# Patient Record
Sex: Female | Born: 1960
Health system: Southern US, Community
[De-identification: ages and names within clinical notes are randomized; demographics above are authoritative.]

## PROBLEM LIST (undated history)

## (undated) DIAGNOSIS — N809 Endometriosis, unspecified: Secondary | ICD-10-CM

## (undated) DIAGNOSIS — T7840XA Allergy, unspecified, initial encounter: Secondary | ICD-10-CM

## (undated) DIAGNOSIS — K602 Anal fissure, unspecified: Secondary | ICD-10-CM

## (undated) DIAGNOSIS — E0590 Thyrotoxicosis, unspecified without thyrotoxic crisis or storm: Secondary | ICD-10-CM

## (undated) HISTORY — PX: OTHER SURGICAL HISTORY: SHX169

## (undated) HISTORY — DX: Anal fissure, unspecified: K60.2

## (undated) HISTORY — DX: Allergy, unspecified, initial encounter: T78.40XA

## (undated) HISTORY — DX: Endometriosis, unspecified: N80.9

## (undated) HISTORY — DX: Thyrotoxicosis, unspecified without thyrotoxic crisis or storm: E05.90

## (undated) HISTORY — PX: LAPAROSCOPY: SHX197

---

## 1998-08-19 ENCOUNTER — Other Ambulatory Visit: Admission: RE | Admit: 1998-08-19 | Discharge: 1998-08-19 | Payer: Self-pay | Admitting: Obstetrics and Gynecology

## 1998-08-24 DIAGNOSIS — E039 Hypothyroidism, unspecified: Secondary | ICD-10-CM | POA: Insufficient documentation

## 1998-08-24 DIAGNOSIS — Z8639 Personal history of other endocrine, nutritional and metabolic disease: Secondary | ICD-10-CM | POA: Insufficient documentation

## 1998-09-03 ENCOUNTER — Other Ambulatory Visit: Admission: RE | Admit: 1998-09-03 | Discharge: 1998-09-03 | Payer: Self-pay | Admitting: Endocrinology

## 1999-08-05 ENCOUNTER — Other Ambulatory Visit: Admission: RE | Admit: 1999-08-05 | Discharge: 1999-08-05 | Payer: Self-pay | Admitting: *Deleted

## 2000-08-09 ENCOUNTER — Other Ambulatory Visit: Admission: RE | Admit: 2000-08-09 | Discharge: 2000-08-09 | Payer: Self-pay | Admitting: *Deleted

## 2001-08-12 ENCOUNTER — Other Ambulatory Visit: Admission: RE | Admit: 2001-08-12 | Discharge: 2001-08-12 | Payer: Self-pay | Admitting: Obstetrics and Gynecology

## 2002-08-21 ENCOUNTER — Other Ambulatory Visit: Admission: RE | Admit: 2002-08-21 | Discharge: 2002-08-21 | Payer: Self-pay | Admitting: Obstetrics and Gynecology

## 2003-10-08 ENCOUNTER — Other Ambulatory Visit: Admission: RE | Admit: 2003-10-08 | Discharge: 2003-10-08 | Payer: Self-pay | Admitting: Obstetrics and Gynecology

## 2004-07-07 ENCOUNTER — Ambulatory Visit: Payer: Self-pay | Admitting: Internal Medicine

## 2004-08-06 ENCOUNTER — Ambulatory Visit: Payer: Self-pay | Admitting: Internal Medicine

## 2004-10-10 ENCOUNTER — Ambulatory Visit: Payer: Self-pay | Admitting: Internal Medicine

## 2005-01-20 ENCOUNTER — Ambulatory Visit: Payer: Self-pay | Admitting: Internal Medicine

## 2005-07-20 ENCOUNTER — Ambulatory Visit: Payer: Self-pay | Admitting: Internal Medicine

## 2005-08-24 LAB — HM MAMMOGRAPHY: HM Mammogram: NORMAL

## 2006-01-11 ENCOUNTER — Ambulatory Visit: Payer: Self-pay | Admitting: Internal Medicine

## 2006-01-19 ENCOUNTER — Ambulatory Visit: Payer: Self-pay | Admitting: Internal Medicine

## 2006-06-25 ENCOUNTER — Ambulatory Visit: Payer: Self-pay | Admitting: Internal Medicine

## 2006-06-25 LAB — CONVERTED CEMR LAB: TSH: 1.61 microintl units/mL (ref 0.35–5.50)

## 2006-07-02 ENCOUNTER — Ambulatory Visit: Payer: Self-pay | Admitting: Internal Medicine

## 2006-12-23 LAB — CONVERTED CEMR LAB: Pap Smear: NORMAL

## 2007-06-28 ENCOUNTER — Ambulatory Visit: Payer: Self-pay | Admitting: Internal Medicine

## 2007-06-28 LAB — CONVERTED CEMR LAB
ALT: 16 units/L (ref 0–35)
AST: 20 units/L (ref 0–37)
Albumin: 3.4 g/dL — ABNORMAL LOW (ref 3.5–5.2)
Alkaline Phosphatase: 80 units/L (ref 39–117)
BUN: 9 mg/dL (ref 6–23)
Basophils Absolute: 0.1 10*3/uL (ref 0.0–0.1)
Basophils Relative: 1.3 % — ABNORMAL HIGH (ref 0.0–1.0)
Bilirubin Urine: NEGATIVE
Bilirubin, Direct: 0.1 mg/dL (ref 0.0–0.3)
Blood in Urine, dipstick: NEGATIVE
CO2: 27 meq/L (ref 19–32)
Calcium: 9.2 mg/dL (ref 8.4–10.5)
Chloride: 106 meq/L (ref 96–112)
Cholesterol: 193 mg/dL (ref 0–200)
Creatinine, Ser: 0.7 mg/dL (ref 0.4–1.2)
Eosinophils Absolute: 0.2 10*3/uL (ref 0.0–0.6)
Eosinophils Relative: 2.1 % (ref 0.0–5.0)
GFR calc Af Amer: 116 mL/min
GFR calc non Af Amer: 96 mL/min
Glucose, Bld: 81 mg/dL (ref 70–99)
Glucose, Urine, Semiquant: NEGATIVE
HCT: 36.8 % (ref 36.0–46.0)
HDL: 42.8 mg/dL (ref >39.0)
Hemoglobin: 12.6 g/dL (ref 12.0–15.0)
Ketones, urine, test strip: NEGATIVE
LDL Cholesterol: 120 mg/dL — ABNORMAL HIGH (ref 0–99)
Lymphocytes Relative: 21.1 % (ref 12.0–46.0)
MCHC: 34.2 g/dL (ref 30.0–36.0)
MCV: 93.8 fL (ref 78.0–100.0)
Monocytes Absolute: 0.5 10*3/uL (ref 0.2–0.7)
Monocytes Relative: 5 % (ref 3.0–11.0)
Neutro Abs: 7.2 10*3/uL (ref 1.4–7.7)
Neutrophils Relative %: 70.5 % (ref 43.0–77.0)
Nitrite: NEGATIVE
Platelets: 283 10*3/uL (ref 150–400)
Potassium: 4.6 meq/L (ref 3.5–5.1)
Protein, U semiquant: NEGATIVE
RBC: 3.93 M/uL (ref 3.87–5.11)
RDW: 12 % (ref 11.5–14.6)
Sodium: 140 meq/L (ref 135–145)
Specific Gravity, Urine: >=1.03
TSH: 1.65 microintl units/mL (ref 0.35–5.50)
Total Bilirubin: 0.5 mg/dL (ref 0.3–1.2)
Total CHOL/HDL Ratio: 4.5
Total Protein: 6.8 g/dL (ref 6.0–8.3)
Triglycerides: 151 mg/dL — ABNORMAL HIGH (ref 0–149)
Urobilinogen, UA: 0.2
VLDL: 30 mg/dL (ref 0–40)
WBC Urine, dipstick: NEGATIVE
WBC: 10.2 10*3/uL (ref 4.5–10.5)
pH: 5

## 2007-07-04 ENCOUNTER — Ambulatory Visit: Payer: Self-pay | Admitting: Internal Medicine

## 2007-07-04 DIAGNOSIS — N809 Endometriosis, unspecified: Secondary | ICD-10-CM | POA: Insufficient documentation

## 2008-06-13 ENCOUNTER — Telehealth: Payer: Self-pay | Admitting: Internal Medicine

## 2008-06-18 ENCOUNTER — Ambulatory Visit: Payer: Self-pay | Admitting: Internal Medicine

## 2008-06-18 LAB — CONVERTED CEMR LAB: TSH: 1.08 microintl units/mL (ref 0.35–5.50)

## 2008-06-27 ENCOUNTER — Ambulatory Visit: Payer: Self-pay | Admitting: Internal Medicine

## 2010-02-05 ENCOUNTER — Ambulatory Visit: Payer: Self-pay | Admitting: Internal Medicine

## 2010-02-05 LAB — CONVERTED CEMR LAB
ALT: 21 units/L (ref 0–35)
AST: 23 units/L (ref 0–37)
Albumin: 4.3 g/dL (ref 3.5–5.2)
Alkaline Phosphatase: 95 units/L (ref 39–117)
BUN: 15 mg/dL (ref 6–23)
Basophils Absolute: 0 10*3/uL (ref 0.0–0.1)
Basophils Relative: 0.3 % (ref 0.0–3.0)
Bilirubin Urine: NEGATIVE
Bilirubin, Direct: 0.1 mg/dL (ref 0.0–0.3)
Blood in Urine, dipstick: NEGATIVE
CO2: 29 meq/L (ref 19–32)
Calcium: 9.3 mg/dL (ref 8.4–10.5)
Chloride: 108 meq/L (ref 96–112)
Cholesterol: 171 mg/dL (ref 0–200)
Creatinine, Ser: 0.6 mg/dL (ref 0.4–1.2)
Eosinophils Absolute: 0.2 10*3/uL (ref 0.0–0.7)
Eosinophils Relative: 3.2 % (ref 0.0–5.0)
GFR calc non Af Amer: 108.53 mL/min (ref >60)
Glucose, Bld: 86 mg/dL (ref 70–99)
Glucose, Urine, Semiquant: NEGATIVE
HCT: 37.5 % (ref 36.0–46.0)
HDL: 49.3 mg/dL (ref >39.00)
Hemoglobin: 12.6 g/dL (ref 12.0–15.0)
Ketones, urine, test strip: NEGATIVE
LDL Cholesterol: 95 mg/dL (ref 0–99)
Lymphocytes Relative: 25 % (ref 12.0–46.0)
Lymphs Abs: 1.8 10*3/uL (ref 0.7–4.0)
MCHC: 33.6 g/dL (ref 30.0–36.0)
MCV: 96.2 fL (ref 78.0–100.0)
Monocytes Absolute: 0.5 10*3/uL (ref 0.1–1.0)
Monocytes Relative: 6.9 % (ref 3.0–12.0)
Neutro Abs: 4.8 10*3/uL (ref 1.4–7.7)
Neutrophils Relative %: 64.6 % (ref 43.0–77.0)
Nitrite: NEGATIVE
Platelets: 213 10*3/uL (ref 150.0–400.0)
Potassium: 4.3 meq/L (ref 3.5–5.1)
RBC: 3.9 M/uL (ref 3.87–5.11)
RDW: 13.2 % (ref 11.5–14.6)
Sodium: 143 meq/L (ref 135–145)
Specific Gravity, Urine: 1.025
TSH: 1.45 microintl units/mL (ref 0.35–5.50)
Total Bilirubin: 0.5 mg/dL (ref 0.3–1.2)
Total CHOL/HDL Ratio: 3
Total Protein: 7 g/dL (ref 6.0–8.3)
Triglycerides: 136 mg/dL (ref 0.0–149.0)
Urobilinogen, UA: 0.2
VLDL: 27.2 mg/dL (ref 0.0–40.0)
WBC Urine, dipstick: NEGATIVE
WBC: 7.4 10*3/uL (ref 4.5–10.5)
pH: 6

## 2010-02-12 ENCOUNTER — Ambulatory Visit: Payer: Self-pay | Admitting: Internal Medicine

## 2010-08-27 ENCOUNTER — Other Ambulatory Visit: Payer: Self-pay | Admitting: Internal Medicine

## 2010-08-27 ENCOUNTER — Ambulatory Visit
Admission: RE | Admit: 2010-08-27 | Discharge: 2010-08-27 | Payer: Self-pay | Source: Home / Self Care | Attending: Internal Medicine | Admitting: Internal Medicine

## 2010-08-27 LAB — TSH: TSH: 1.44 u[IU]/mL (ref 0.35–5.50)

## 2010-09-25 NOTE — Assessment & Plan Note (Signed)
Summary: cpx/no pap/njr   Vital Signs:  Patient profile:   50 year old female Menstrual status:  perimenopausal LMP:     09/18/2009 Height:      65.5 inches Weight:      193 pounds BMI:     31.74 Pulse rate:   60 / minute Pulse rhythm:   regular Resp:     12 per minute BP sitting:   132 / 96  (left arm) Cuff size:   regular  Vitals Entered By: Gladis Riffle, RN (February 12, 2010 10:14 AM) CC: cpx, has gyn, labs done--stopped levothyroxine due to tachycardia, feels better without it--c/o fatigue Is Patient Diabetic? No Comments c/o lump in throat, states hx nodules LMP (date): 09/18/2009     Menstrual Status perimenopausal Enter LMP: 09/18/2009 Last PAP Result Normal-pt's report   CC:  cpx, has gyn, labs done--stopped levothyroxine due to tachycardia, and feels better without it--c/o fatigue.  History of Present Illness: cpx  Preventive Screening-Counseling & Management  Alcohol-Tobacco     Smoking Status: quit     Year Quit: 1999  Current Problems (verified): 1)  Preventive Health Care  (ICD-V70.0) 2)  Hyperthyroidism  (ICD-242.90) 3)  Endometriosis  (ICD-617.9) 4)  Hypothyroidism  (ICD-244.9)  Current Medications (verified): 1)  None  Allergies: 1)  ! Cephalexin (Cephalexin) 2)  ! Levothyroxine Sodium (Levothyroxine Sodium)  Past History:  Past Surgical History: Last updated: 07/04/2007 laparoscopy x 2  Family History: Last updated: 02/12/2010 mother hypothyroid, stroke (77 yo) father alive and well  Social History: Last updated: 02/12/2010 Married Former Smoker Alcohol use-no Regular exercise-no 2 kids - healthy  Risk Factors: Exercise: no (07/04/2007)  Risk Factors: Smoking Status: quit (02/12/2010)  Past Medical History: Hyperthyroidism---multinodular goiter (I-131) see duplicate record endometriosis  Family History: mother hypothyroid, stroke (40 yo) father alive and well  Social History: Married Former Smoker Alcohol  use-no Regular exercise-no 2 kids - healthy  Physical Exam  General:  alert and well-developed.   Head:  normocephalic and atraumatic.   Eyes:  pupils equal and pupils round.   Ears:  R ear normal and L ear normal.   Neck:  No deformities, , or tenderness noted. thyroid enlarged. bilateral nodules---maximum 1 cm Chest Wall:  No deformities, masses, or tenderness noted. Lungs:  normal respiratory effort and no intercostal retractions.   Heart:  normal rate and regular rhythm.   Abdomen:  soft and non-tender.   Msk:  No deformity or scoliosis noted of thoracic or lumbar spine.   Neurologic:  cranial nerves II-XII intact and gait normal.   Skin:  Intact without suspicious lesions or rashes Cervical Nodes:  no anterior cervical adenopathy and no posterior cervical adenopathy.   Psych:  memory intact for recent and remote and good eye contact.     Impression & Recommendations:  Problem # 1:  PREVENTIVE HEALTH CARE (ICD-V70.0) discussed need for aggressive weight loss advised daily exercis of at least one hour advised low calorie diet  Problem # 2:  HYPERTHYROIDISM (ICD-242.90) has been treated required replacement therapy---has stopped and TSH normal---ok to stay off replacement thyroid meds  Other Orders: Tdap => 13yrs IM (04540) Admin 1st Vaccine ([id number])  Patient Instructions: 1)  Please schedule a follow-up appointment in 6 months labs only 2)  tsh 241.9   Immunizations Administered:  Tetanus Vaccine:    Vaccine Type: Tdap    Site: left deltoid    Mfr: GlaxoSmithKline    Dose: 0.5 ml    Route: IM  Given by: Gladis Riffle, RN    Exp. Date: 11/15/2011    Lot #: [id number]

## 2010-11-13 ENCOUNTER — Telehealth: Payer: Self-pay | Admitting: Internal Medicine

## 2010-11-13 NOTE — Telephone Encounter (Signed)
Pt is having sinus pain in her face.  She wants to know if you can see her or if she should go to [facility]

## 2010-11-14 ENCOUNTER — Ambulatory Visit: Payer: Self-pay | Admitting: Internal Medicine

## 2010-11-14 NOTE — Telephone Encounter (Signed)
lmoam to return call to confirm appointment as indicated.

## 2010-11-14 NOTE — Telephone Encounter (Signed)
lmoam to confirm appt as indicated.

## 2010-11-14 NOTE — Telephone Encounter (Signed)
See me 1130 today

## 2010-11-14 NOTE — Telephone Encounter (Signed)
Sent to [place] to add appt.

## 2012-04-24 LAB — HM MAMMOGRAPHY

## 2012-04-24 LAB — HM PAP SMEAR

## 2012-04-27 ENCOUNTER — Other Ambulatory Visit: Payer: Self-pay

## 2012-05-04 ENCOUNTER — Encounter: Payer: Self-pay | Admitting: Internal Medicine

## 2012-05-30 ENCOUNTER — Other Ambulatory Visit (INDEPENDENT_AMBULATORY_CARE_PROVIDER_SITE_OTHER): Payer: BC Managed Care – PPO

## 2012-05-30 DIAGNOSIS — Z0000 Encounter for general adult medical examination without abnormal findings: Secondary | ICD-10-CM

## 2012-05-30 LAB — HEPATIC FUNCTION PANEL
ALT: 18 U/L (ref 0–35)
Albumin: 4.2 g/dL (ref 3.5–5.2)
Alkaline Phosphatase: 80 U/L (ref 39–117)
Bilirubin, Direct: 0.1 mg/dL (ref 0.0–0.3)
Total Protein: 7.8 g/dL (ref 6.0–8.3)

## 2012-05-30 LAB — BASIC METABOLIC PANEL
CO2: 30 mEq/L (ref 19–32)
Chloride: 107 mEq/L (ref 96–112)
Creatinine, Ser: 0.6 mg/dL (ref 0.4–1.2)
Sodium: 143 mEq/L (ref 135–145)

## 2012-05-30 LAB — LIPID PANEL
LDL Cholesterol: 109 mg/dL — ABNORMAL HIGH (ref 0–99)
Total CHOL/HDL Ratio: 4
VLDL: 30.2 mg/dL (ref 0.0–40.0)

## 2012-05-30 LAB — CBC WITH DIFFERENTIAL/PLATELET
Basophils Relative: 0.3 % (ref 0.0–3.0)
Eosinophils Absolute: 0.1 10*3/uL (ref 0.0–0.7)
Eosinophils Relative: 1.7 % (ref 0.0–5.0)
Hemoglobin: 13 g/dL (ref 12.0–15.0)
Lymphocytes Relative: 24.7 % (ref 12.0–46.0)
MCHC: 33.1 g/dL (ref 30.0–36.0)
MCV: 95.6 fl (ref 78.0–100.0)
Monocytes Absolute: 0.5 10*3/uL (ref 0.1–1.0)
Neutro Abs: 4.7 10*3/uL (ref 1.4–7.7)
Neutrophils Relative %: 66.7 % (ref 43.0–77.0)
RBC: 4.11 Mil/uL (ref 3.87–5.11)
WBC: 7.1 10*3/uL (ref 4.5–10.5)

## 2012-05-30 LAB — POCT URINALYSIS DIPSTICK
Blood, UA: NEGATIVE
Ketones, UA: NEGATIVE
Protein, UA: NEGATIVE
Spec Grav, UA: 1.02
Urobilinogen, UA: 0.2
pH, UA: 7.5

## 2012-06-06 ENCOUNTER — Encounter: Payer: Self-pay | Admitting: Internal Medicine

## 2012-06-06 ENCOUNTER — Ambulatory Visit (INDEPENDENT_AMBULATORY_CARE_PROVIDER_SITE_OTHER): Payer: BC Managed Care – PPO | Admitting: Internal Medicine

## 2012-06-06 VITALS — BP 132/82 | HR 76 | Temp 98.0°F | Ht 66.0 in | Wt 183.0 lb

## 2012-06-06 DIAGNOSIS — Z0000 Encounter for general adult medical examination without abnormal findings: Secondary | ICD-10-CM

## 2012-06-06 NOTE — Progress Notes (Signed)
Patient ID: Alison Holloway, female   DOB: 1961/06/19, 51 y.o.   MRN: [id number] cpx  Past Medical History  Diagnosis Date  . Hyperthyroidism   . Endometriosis     History   Social History  . Marital Status: Married    Spouse Name: N/A    Number of Children: N/A  . Years of Education: N/A   Occupational History  . Not on file.   Social History Main Topics  . Smoking status: Former Games developer  . Smokeless tobacco: Not on file  . Alcohol Use: No  . Drug Use: No  . Sexually Active: Not on file   Other Topics Concern  . Not on file   Social History Narrative  . No narrative on file    Past Surgical History  Procedure Date  . Laparoscopy     x2    Family History  Problem Relation Age of Onset  . Stroke Mother   . Hypothyroidism Mother     Allergies  Allergen Reactions  . Cephalexin     REACTION: rash  . Levothyroxine Sodium     REACTION: tachycardia    Current Outpatient Prescriptions on File Prior to Visit  Medication Sig Dispense Refill  . diphenhydrAMINE (SOMINEX) 25 MG tablet Take 25 mg by mouth at bedtime as needed.         patient denies chest pain, shortness of breath, orthopnea. Denies lower extremity edema, abdominal pain, change in appetite, change in bowel movements. Patient denies rashes, musculoskeletal complaints. No other specific complaints in a complete review of systems.   BP 132/82  Pulse 76  Temp 98 F (36.7 C) (Oral)  Ht 5\' 6"  (1.676 m)  Wt 183 lb (83.008 kg)  BMI 29.54 kg/m2  Well-developed well-nourished female in no acute distress. HEENT exam atraumatic, normocephalic, extraocular muscles are intact. Neck is supple. No jugular venous distention no thyromegaly. Chest clear to auscultation without increased work of breathing. Cardiac exam S1 and S2 are regular. Abdominal exam active bowel sounds, soft, nontender. Extremities no edema. Neurologic exam she is alert without any motor sensory deficits. Gait is normal.  A/P- well visit -  Health maint UTD

## 2014-05-21 ENCOUNTER — Encounter: Payer: Self-pay | Admitting: Internal Medicine

## 2014-05-21 ENCOUNTER — Ambulatory Visit (INDEPENDENT_AMBULATORY_CARE_PROVIDER_SITE_OTHER): Payer: BC Managed Care – PPO | Admitting: Internal Medicine

## 2014-05-21 VITALS — BP 136/84 | HR 84 | Temp 98.3°F | Resp 16 | Wt 167.0 lb

## 2014-05-21 DIAGNOSIS — E039 Hypothyroidism, unspecified: Secondary | ICD-10-CM

## 2014-05-21 DIAGNOSIS — E042 Nontoxic multinodular goiter: Secondary | ICD-10-CM

## 2014-05-21 NOTE — Progress Notes (Addendum)
Patient ID: Alison Holloway, female   DOB: 1961-02-13, 53 y.o.   MRN: [id number]   HPI  Alison CERVENY is a 53 y.o.-year-old female, self-referred, for management of h/o Graves ds., h/o post-ablative hypothyroidism. Pt saw Dr Cato Mulligan in the past, now seeing her ObGyn, no PCP.  Pt. has been dx with Graves ds ~1996, and had RAI tx in ~2000 (no records available), then developed hypothyroidism afterwards >> was on Levothyroxine then stopped 3 years ago as she started to feel hyperthyroid again >> resolved after stopping LT4.  I reviewed pt's thyroid tests: Lab Results  Component Value Date   TSH 1.95 05/30/2012   TSH 1.44 08/27/2010   TSH 1.45 02/05/2010   TSH 1.08 06/18/2008   TSH 1.65 06/28/2007   TSH 1.61 06/25/2006    Pt describes: - no fatigue - + weight loss (30 lbs over 2 years) - no cold intolerance - no constipation - no dry skin - + hair falling, not abnormal - no depression, no anxiety - no palpitations - no tremors - no insomnia - + hot flushes  Pt is feeling nodules in neck - "they come and go", hoarseness, dysphagia/odynophagia, SOB with lying down. She mentions she was told she had nodules in the past and had a thyroid U/S (no records).   She takes Benadryl every night (for sinusitis) for years >> sore throat.   She has + FH of thyroid disorders in: hyperthyroidism. No FH of thyroid cancer.  No h/o radiation tx to head or neck. No recent use of iodine supplements.  ROS: Constitutional: see HPI, + increased urination Eyes: no blurry vision, no xerophthalmia ENT: see HPI Cardiovascular: no CP/SOB/palpitations/leg swelling Respiratory: no cough/SOB Gastrointestinal: no N/V/D/C Musculoskeletal: no muscle/joint aches Skin: no rashes Neurological: no tremors/numbness/tingling/dizziness Psychiatric: no depression/anxiety  Past Medical History  Diagnosis Date  . Hyperthyroidism   . Endometriosis    Past Surgical History  Procedure Laterality Date  . Laparoscopy       x2   History   Social History  . Marital Status: Married    Spouse Name: N/A    Number of Children: 2   Occupational History  . none   Social History Main Topics  . Smoking status: Former Games developer, quit in 1999  . Smokeless tobacco: No  . Alcohol Use: No  . Drug Use: No   Current Outpatient Prescriptions on File Prior to Visit  Medication Sig Dispense Refill  . diphenhydrAMINE (SOMINEX) 25 MG tablet Take 25 mg by mouth at bedtime as needed.       No current facility-administered medications on file prior to visit.   Allergies  Allergen Reactions  . Cephalexin     REACTION: rash  . Levothyroxine Sodium     REACTION: tachycardia   Family History  Problem Relation Age of Onset  . Stroke Mother   . Hypothyroidism Mother   . Hypertension Mother    PE: BP 136/84  Pulse 84  Temp(Src) 98.3 F (36.8 C) (Oral)  Resp 16  Wt 167 lb (75.751 kg) Body mass index is 26.97 kg/(m^2).  Wt Readings from Last 3 Encounters:  05/21/14 167 lb (75.751 kg)  06/06/12 183 lb (83.008 kg)  02/12/10 193 lb (87.544 kg)   Constitutional: overweight, in NAD Eyes: PERRLA, EOMI, no exophthalmos ENT: moist mucous membranes, + thyromegaly - fullness palpated (R lobe), no cervical lymphadenopathy Cardiovascular: RRR, No MRG Respiratory: CTA B Gastrointestinal: abdomen soft, NT, ND, BS+ Musculoskeletal: no deformities, strength intact in  all 4 Skin: moist, warm, no rashes Neurological: no tremor with outstretched hands, DTR normal in all 4  ASSESSMENT: 1. H/o Graves ds.  2. Hypothyroidism  3. R sided thyroid enlargement  PLAN:  1. And 2. Patient with h/o Graves ds ~20 years ago, s/p RAI ablation and subsequent hypothyroidism, previously on levothyroxine therapy, but now euthyroid off LT4. She appears euthyroid. She does have fullness in the R lobe but no neck compression symptoms - We discussed about possible outcomes of RAI tx, many pts remain euthyroid over time. This can mean a  higher risk of recurrence of the hyperthyroidism, so we need to keep an eye on her TFTs - at least every year - will check thyroid tests today: TSH, free T4, free T3 - If these are abnormal, she will need to return in 6-8 weeks for repeat labs - If these are normal, I will see her back in 1 year  3. Thyroid enlargement: - will check a thyroid U/S   Component     Latest Ref Rng 05/22/2014  TSH     0.350 - 4.500 uIU/mL 1.870  Free T4     0.80 - 1.80 ng/dL 4.78  T3, Free     2.3 - 4.2 pg/mL 3.2   06/06/2014 CLINICAL DATA: Hypothyroidism  EXAM: THYROID ULTRASOUND  TECHNIQUE: Ultrasound examination of the thyroid gland and adjacent soft tissues was performed.  COMPARISON: None.  FINDINGS: Right thyroid lobe  Measurements: 7.1 x 2.9 x 3.9 cm. Solid complex right upper pole nodule with calcifications measures 4.4 x 2.5 x 3.3 cm. Hypoechoic lower pole nodule measures 1.6 x 1.3 x 1.7 cm.  Left thyroid lobe  Measurements: 4.4 x 0.9 x 1.2 cm. 5 mm upper pole nodule. 5 mm lower pole nodule.  Isthmus  Thickness: 2 mm. No nodules visualized.  Lymphadenopathy  None visualized.  IMPRESSION: Bilateral nodules. Dominant 4.4 cm right upper pole complex nodule. Findings meet consensus criteria for biopsy. Ultrasound-guided fine needle aspiration should be considered, as per the consensus statement: Management of Thyroid Nodules Detected at [place]: Society of Radiologists in Ultrasound Consensus Conference Statement. Radiology 2005; [id number].   Electronically Signed By: Maryclare Bean M.D. On: 06/06/2014 14:07  I suggested FNA of the 2 dominant nodules:  Adequacy Reason Satisfactory For Evaluation. Diagnosis THYROID, FINE NEEDLE ASPIRATION, RLP (SPECIMEN 1 OF 2 COLLECTED ON 06/12/2014) BENIGN. 9BETHESDA CLASS II). FINDINGS CONSISTENT WITH BENIGN FOLLICULAR NODULE. Jimmy Picket MD Pathologist, Electronic Signature (Case signed 06/13/2014) Specimen Clinical  Information Hypothyroidism, RLP hypoechoic nodule 1.6 x 1.3 x 1.7cm Source Thyroid, Fine Needle Aspiration, RLP, (Specimen 1 of 2, collected on 06/12/2014)  Adequacy Reason Satisfactory For Evaluation. Diagnosis THYROID, FINE NEEDLE ASPIRATION, RUP (SPECIMEN 2 OF 2, COLLECTED ON 06/12/14): BENIGN. (BETHESDA CLASS II). FINDINGS CONSISTENT WITH BENIGN FOLLICULAR NODULE. Jimmy Picket MD Pathologist, Electronic Signature (Case signed 06/13/2014) Specimen Clinical Information Hypothyroidsim, RUP solid complex nodule with microcalcifications 4.4 x 2.5 x 3.3cm Source Thyroid, Fine Needle Aspiration, RUP, (Specimen 2 of 2, collected on 06/12/2014)  Benign thyroid nodules. Will see her back in 1 year, as planned.

## 2014-05-21 NOTE — Patient Instructions (Signed)
Please stop at [facility] lab downstairs. You will be called with the U/S schedule. Please come back for a follow-up appointment in 1 year.

## 2014-05-22 ENCOUNTER — Other Ambulatory Visit: Payer: Self-pay | Admitting: Internal Medicine

## 2014-05-22 LAB — TSH: TSH: 1.87 u[IU]/mL (ref 0.350–4.500)

## 2014-05-22 LAB — T3, FREE: T3, Free: 3.2 pg/mL (ref 2.3–4.2)

## 2014-05-22 LAB — T4, FREE: FREE T4: 1.15 ng/dL (ref 0.80–1.80)

## 2014-06-06 ENCOUNTER — Ambulatory Visit
Admission: RE | Admit: 2014-06-06 | Discharge: 2014-06-06 | Disposition: A | Discharge status: Home / Self Care | Payer: BC Managed Care – PPO | Source: Ambulatory Visit | Attending: Internal Medicine | Admitting: Internal Medicine

## 2014-06-07 ENCOUNTER — Encounter: Payer: Self-pay | Admitting: Internal Medicine

## 2014-06-07 NOTE — Addendum Note (Signed)
Addended by: Carlus PavlovGHERGHE, Norberto Wishon on: 06/07/2014 12:19 PM   Modules accepted: Orders

## 2014-06-12 ENCOUNTER — Ambulatory Visit
Admission: RE | Admit: 2014-06-12 | Discharge: 2014-06-12 | Disposition: A | Discharge status: Home / Self Care | Payer: BC Managed Care – PPO | Source: Ambulatory Visit | Attending: Internal Medicine | Admitting: Internal Medicine

## 2014-06-12 ENCOUNTER — Other Ambulatory Visit (HOSPITAL_COMMUNITY)
Admission: RE | Admit: 2014-06-12 | Discharge: 2014-06-12 | Disposition: A | Discharge status: Home / Self Care | Payer: BC Managed Care – PPO | Source: Ambulatory Visit | Attending: Interventional Radiology | Admitting: Interventional Radiology

## 2014-06-12 DIAGNOSIS — E041 Nontoxic single thyroid nodule: Secondary | ICD-10-CM | POA: Diagnosis present

## 2014-06-12 DIAGNOSIS — E042 Nontoxic multinodular goiter: Secondary | ICD-10-CM

## 2014-09-04 ENCOUNTER — Encounter: Payer: BC Managed Care – PPO | Admitting: Internal Medicine

## 2015-01-13 IMAGING — US US SOFT TISSUE HEAD/NECK
1 series · 14 of 25 positions shown · non-contrast
Comparison: None.

CLINICAL DATA: Hypothyroidism

EXAM:
THYROID ULTRASOUND
TECHNIQUE: Ultrasound examination of the thyroid gland and adjacent soft
tissues was performed.

[Series 1: us soft tissue head/neck · 0.07mm/px · 14 of 62 slices shown]
[im 1/62]
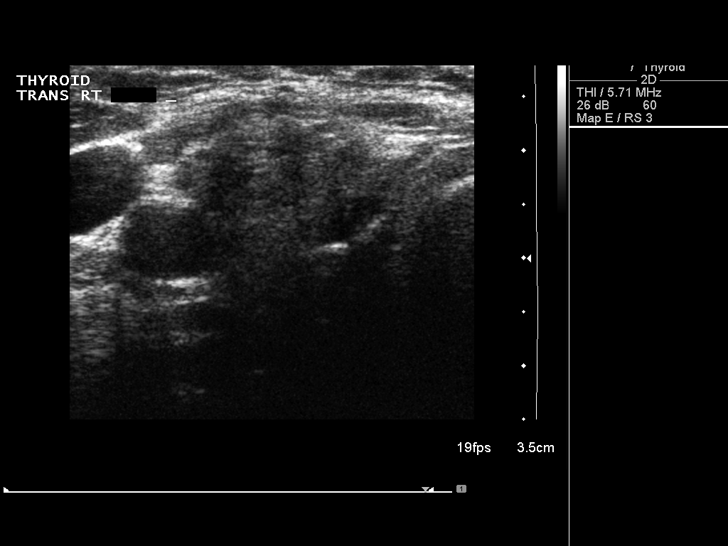
[im 6/62]
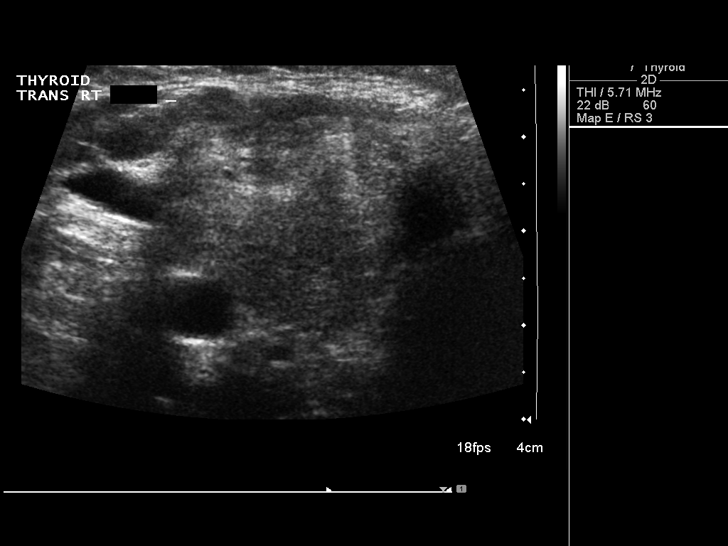
[im 11/62]
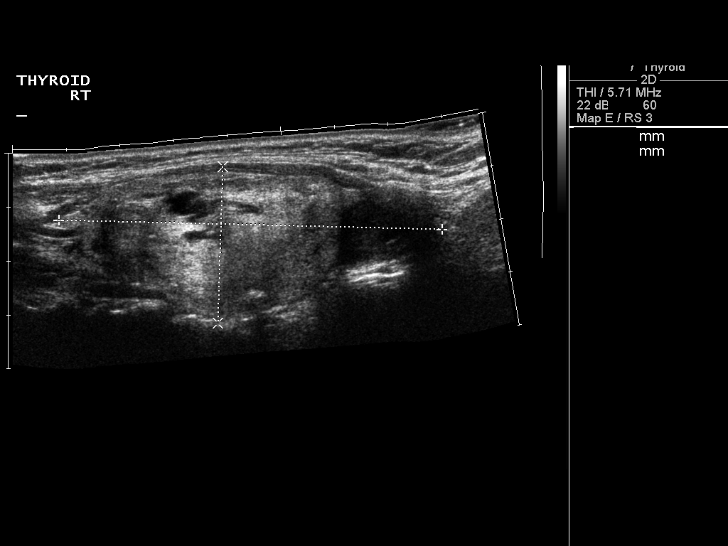
[im 16/62]
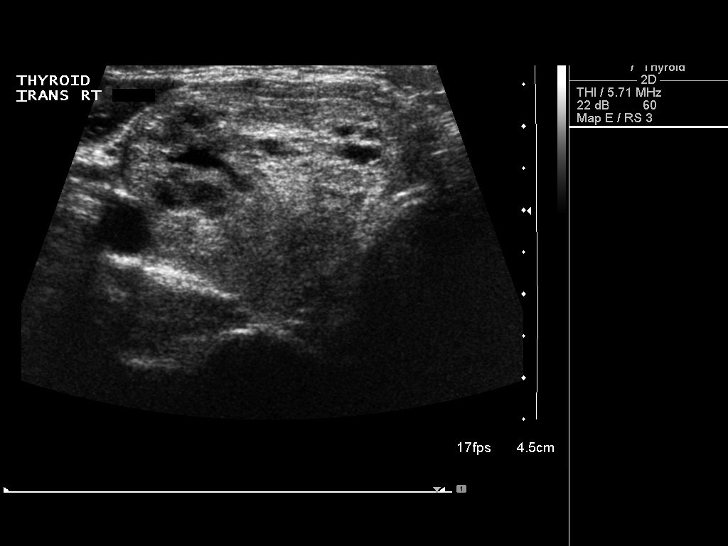
[im 21/62]
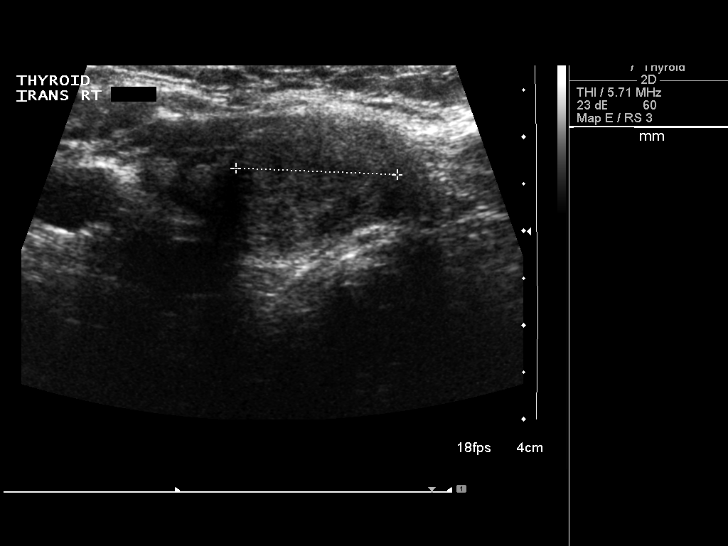
[im 23/62]
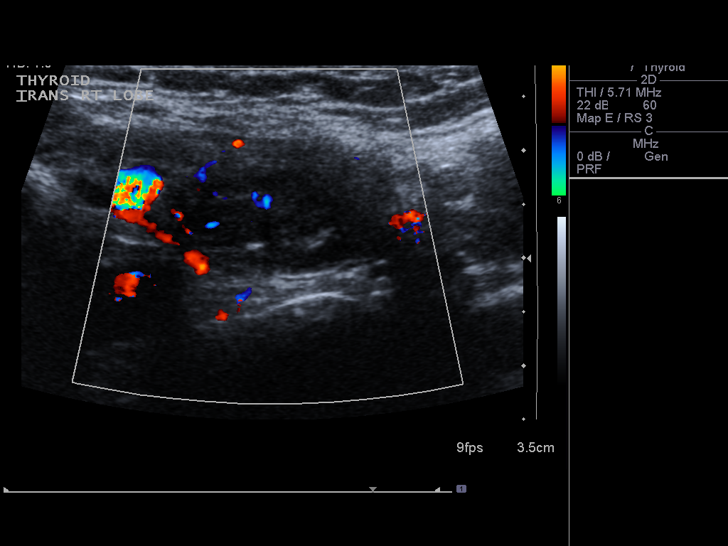
[im 28/62]
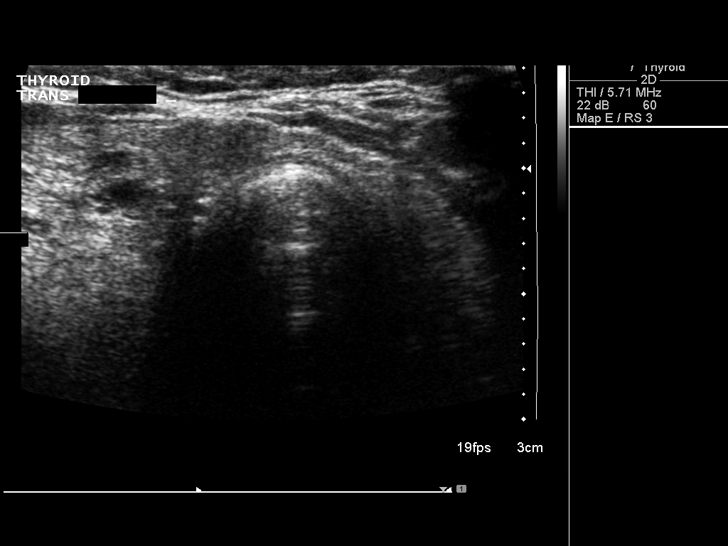
[im 34/62]
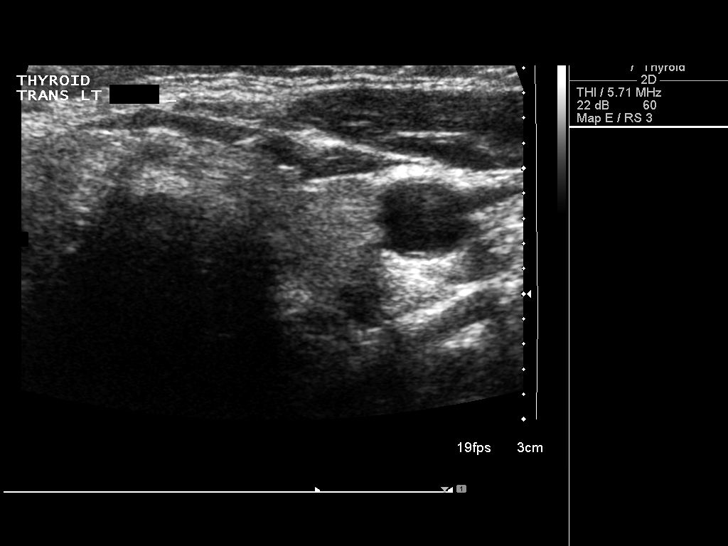
[im 39/62]
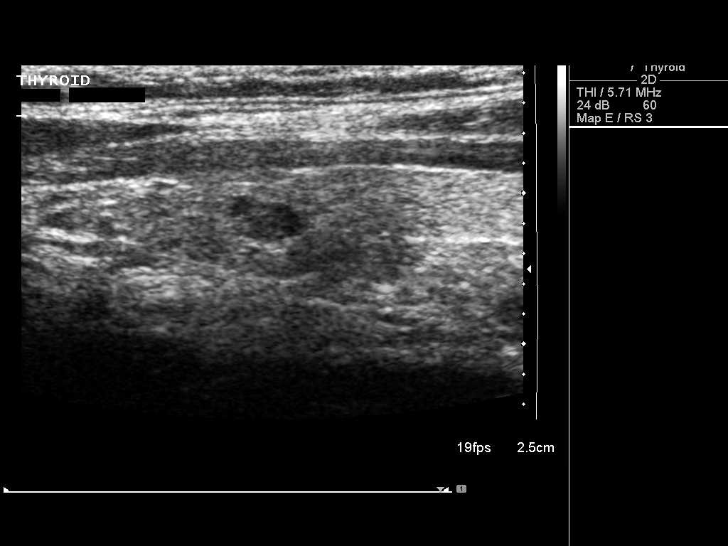
[im 41/62]
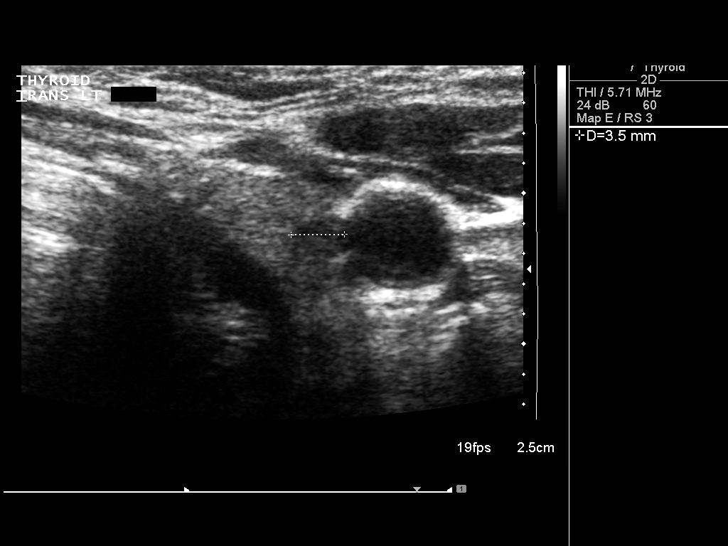
[im 46/62]
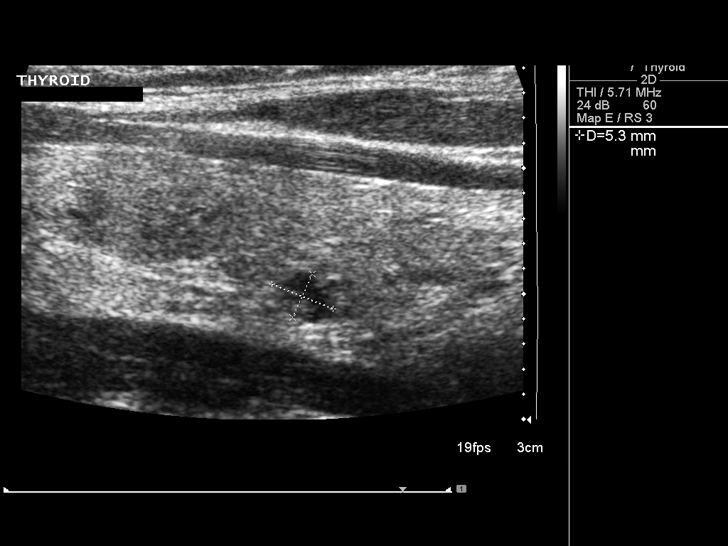
[im 51/62]
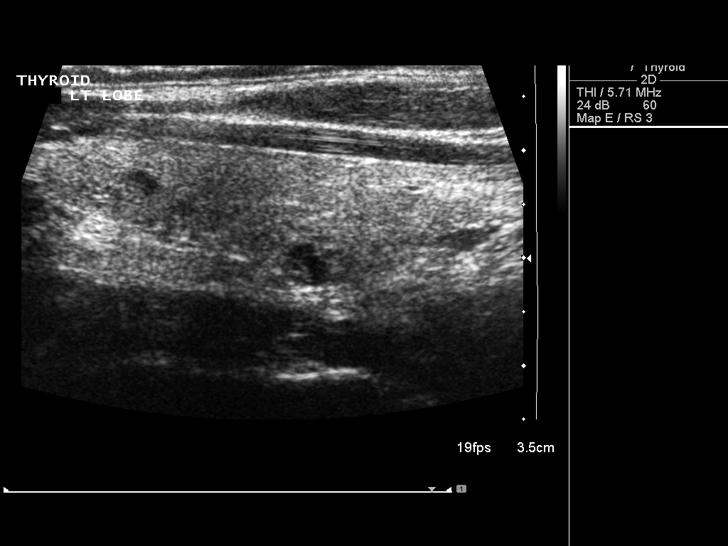
[im 56/62]
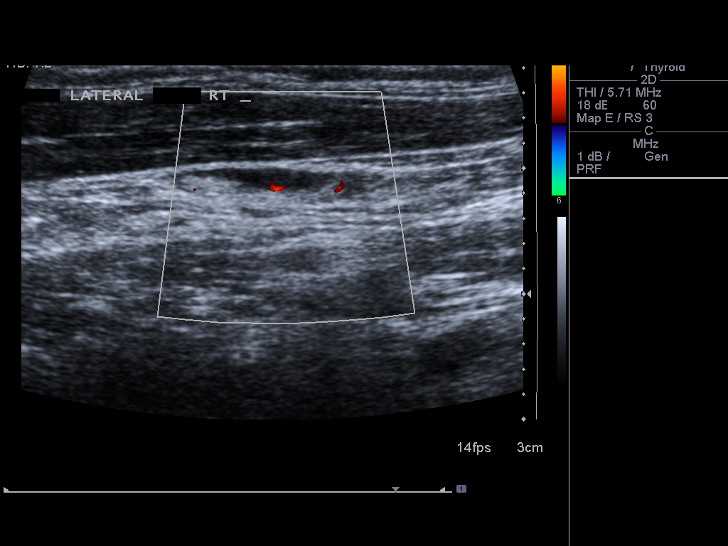
[im 62/62]
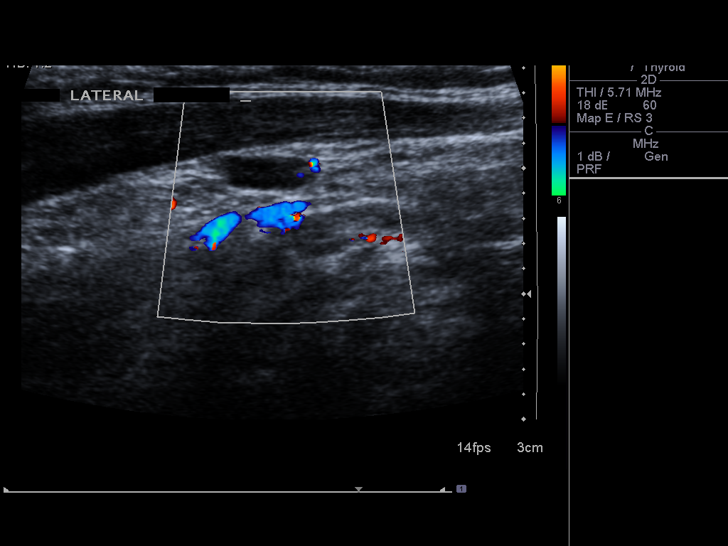

[14 of 25 positions shown; findings below may reference images not displayed]

FINDINGS: Right thyroid lobe

Measurements: 7.1 x 2.9 x 3.9 cm. Solid complex right upper pole
nodule with calcifications measures 4.4 x 2.5 x 3.3 cm. Hypoechoic
lower pole nodule measures 1.6 x 1.3 x 1.7 cm.

Left thyroid lobe

Measurements: 4.4 x 0.9 x 1.2 cm. 5 mm upper pole nodule. 5 mm lower
pole nodule.

Isthmus

Thickness: 2 mm.  No nodules visualized.

Lymphadenopathy

None visualized.
IMPRESSION: Bilateral nodules. Dominant 4.4 cm right upper pole complex nodule.
Findings meet consensus criteria for biopsy. Ultrasound-guided fine
needle aspiration should be considered, as per the consensus
statement: Management of Thyroid Nodules Detected at [place]: Society of
Radiologists in Ultrasound Consensus Conference Statement. Radiology

## 2015-02-05 ENCOUNTER — Ambulatory Visit (INDEPENDENT_AMBULATORY_CARE_PROVIDER_SITE_OTHER): Payer: BLUE CROSS/BLUE SHIELD | Admitting: Internal Medicine

## 2015-02-05 ENCOUNTER — Encounter: Payer: Self-pay | Admitting: Internal Medicine

## 2015-02-05 VITALS — BP 134/80 | Temp 98.5°F | Ht 65.5 in | Wt 170.0 lb

## 2015-02-05 DIAGNOSIS — E042 Nontoxic multinodular goiter: Secondary | ICD-10-CM | POA: Diagnosis not present

## 2015-02-05 DIAGNOSIS — Z8639 Personal history of other endocrine, nutritional and metabolic disease: Secondary | ICD-10-CM

## 2015-02-05 DIAGNOSIS — G479 Sleep disorder, unspecified: Secondary | ICD-10-CM | POA: Diagnosis not present

## 2015-02-05 DIAGNOSIS — Z800 Family history of malignant neoplasm of digestive organs: Secondary | ICD-10-CM

## 2015-02-05 NOTE — Progress Notes (Signed)
Pre visit review using our clinic review tool, if applicable. No additional management support is needed unless otherwise documented below in the visit note.  Chief Complaint  Patient presents with  . Establish Care    HPI: Patient  Alison Holloway  54 y.o. comes in today for  To establish Care visit  Last visit 2013 dr swords  She is generally well but had radioactive iodine given to her for thyroid possible Graves when she was in elementary school. In the last 5 years she was seen by endocrinology because of multinodular goiter and some nodules. States that she was told that the iodine didn't burn out her old thyroid. Seaw dr G for MNG last year.   2 nodules  .   Had a biopsy apparently was negative was told to monitor her thyroid once year and didn't really need to see the endocrinologist. She hasn't had a mammogram in a while there are difficulties with high deductible in her insurance. Denies any major injuries hospitalizations broken bones. Her last set of labs besides thyroid TSH was in 2013.  Health Maintenance  Topic Date Due  . COLONOSCOPY  08/15/2010  . MAMMOGRAM  04/24/2014  . HIV Screening  01/23/2016 (Originally 08/16/1976)  . INFLUENZA VACCINE  03/25/2015  . PAP SMEAR  04/25/2015  . TETANUS/TDAP  02/13/2020   Health Maintenance Review LIFESTYLE:  Exercise:  Not at this time  neg tad Sugar beverages:no Sleep: Problematic at times taking Unisom thinks it's not working as well. Drug use: no  Colonoscopy: Not had colon cancer screening at this point cost and high deductible has no symptoms but Brother did have colon cancer age 83. PAP: Reported up-to-date MAMMO: Reported overdue.   ROS:  GEN/ HEENT: No fever, significant weight changes sweats headaches vision problems hearing changes, CV/ PULM; No chest pain shortness of breath cough, syncope,edema  change in exercise tolerance. GI /GU: No adominal pain, vomiting, change in bowel habits. No blood in the stool. No  significant GU symptoms. SKIN/HEME: ,no acute skin rashes suspicious lesions or bleeding. No lymphadenopathy, nodules, masses.  NEURO/ PSYCH:  No neurologic signs such as weakness numbness. No depression anxiety. IMM/ Allergy: No unusual infections.  Allergy .   REST of 12 system review negative except as per HPI   Past Medical History  Diagnosis Date  . Hyperthyroidism   . Endometriosis     Past Surgical History  Procedure Laterality Date  . Laparoscopy      x2    Family History  Problem Relation Age of Onset  . Stroke Mother   . Hypothyroidism Mother   . Hypertension Mother   . Hypertension Father   . Depression Brother   . Colon cancer Brother     age 72     History   Social History  . Marital Status: Married    Spouse Name: N/A  . Number of Children: N/A  . Years of Education: N/A   Social History Main Topics  . Smoking status: Former Games developer  . Smokeless tobacco: Never Used  . Alcohol Use: 0.0 oz/week    0 Standard drinks or equivalent per week  . Drug Use: No  . Sexual Activity:    Partners: Male   Other Topics Concern  . None   Social History Narrative   7 hours of sleep per night   Lives with her husband and son  h h of 3     Currently not working   1 dog in  the home.   g2 p2    Soc: etoh  No tobacco .     Outpatient Prescriptions Prior to Visit  Medication Sig Dispense Refill  . diphenhydrAMINE (SOMINEX) 25 MG tablet Take 25 mg by mouth at bedtime as needed.     No facility-administered medications prior to visit.     EXAM:  BP 134/80 mmHg  Temp(Src) 98.5 F (36.9 C) (Oral)  Ht 5' 5.5" (1.664 m)  Wt 170 lb (77.111 kg)  BMI 27.85 kg/m2  LMP 12/23/2011  Body mass index is 27.85 kg/(m^2).  Physical Exam: Vital signs reviewed SEG:BTDV is a well-developed well-nourished alert cooperative    who appearsr stated age in no acute distress.  HEENT: normocephalic atraumatic , Eyes: PERRL EOM's full, conjunctiva clear, Nares: paten,t no  deformity discharge or tenderness., Ears: no deformity  Mouth: clear OP, no lesions, edema.  Moist mucous membranes. Dentition in adequate repair. NECK: supple  thyroid  Palpable  enlarged thyroid more on right than left  no bruits. CHEST/PULM:  Clear to auscultation and percussion breath sounds equal no wheeze , rales or rhonchi. CV: PMI is nondisplaced, S1 S2 no gallops, murmurs, rubs. Peripheral pulses are full without delay..  ABDOMEN: Bowel sounds normal nontender  No guard or rebound, no hepato splenomegal no CVA tenderness. Extremtities:  No clubbing cyanosis or edema, no acute joint swelling or redness no focal atrophy NEURO:  Oriented x3, cranial nerves 3-12 appear to be intact, no obvious focal weakness,gait within normal limits no abnormal reflexes or asymmetrical SKIN: No acute rashes normal turgor, color, no bruising or petechiae. PSYCH: Oriented, good eye contact, no obvious depression anxiety, cognition and judgment appear normal. LN: no cervical  adenopathy  Lab Results  Component Value Date   WBC 7.1 05/30/2012   HGB 13.0 05/30/2012   HCT 39.3 05/30/2012   PLT 230.0 05/30/2012   GLUCOSE 83 05/30/2012   CHOL 191 05/30/2012   TRIG 151.0* 05/30/2012   HDL 52.00 05/30/2012   LDLCALC 109* 05/30/2012   ALT 18 05/30/2012   AST 22 05/30/2012   NA 143 05/30/2012   K 4.5 05/30/2012   CL 107 05/30/2012   CREATININE 0.6 05/30/2012   BUN 11 05/30/2012   CO2 30 05/30/2012   TSH 1.870 05/22/2014   tsh   ASSESSMENT AND PLAN:  Discussed the following assessment and plan:  Multinodular goiter - Biopsy nodule 2015  Family hx of colon cancer - brother  H/O Graves' disease  Sleep disturbance - taking unisom  Insurance limitations  higih deductable for colon  Encouraged to get colonoscopy screening this year will call later in the year for this.  Disc colon screen top of priority list   . Yearly check pv or ov for thyroid .  Patient was under the understanding that she didn't  see the need for endocrinology every year but just get lab work. Although that is not what the office note says. Either way she should have a TSH done in the fall. Patient Care Team: Madelin Headings, MD as PCP - General (Internal Medicine) Genia Del, MD as Consulting Physician (Obstetrics and Gynecology) Fredrich Birks, OD as Referring Physician (Optometry) Carlus Pavlov, MD as Consulting Physician (Internal Medicine) Patient Instructions   Healthy lifestyle includes : At least 150 minutes of exercise weeks  , weight at healthy levels, which is usually   BMI 19-25. Avoid trans fats and processed foods;  Increase fresh fruits and veges to 5 servings per day. And avoid sweet  beverages including tea and juice. Mediterranean diet with olive oil and nuts have been noted to be heart and brain healthy . Avoid tobacco products . Limit  alcohol to  7 per week for women and 14 servings for men.  Get adequate sleep . Wear seat belts . Don't text and drive .   Contact us when ready to proceed with colonscopy . Strongly advise this cause of your brothers  Hx of colon cancer . Yearly  Ov to check thyroid or  Preventive visit .     Neta Mends. Shirlyn Savin M.D.

## 2015-02-05 NOTE — Patient Instructions (Signed)
  Healthy lifestyle includes : At least 150 minutes of exercise weeks  , weight at healthy levels, which is usually   BMI 19-25. Avoid trans fats and processed foods;  Increase fresh fruits and veges to 5 servings per day. And avoid sweet beverages including tea and juice. Mediterranean diet with olive oil and nuts have been noted to be heart and brain healthy . Avoid tobacco products . Limit  alcohol to  7 per week for women and 14 servings for men.  Get adequate sleep . Wear seat belts . Don't text and drive .   Contact us when ready to proceed with colonscopy . Strongly advise this cause of your brothers  Hx of colon cancer . Yearly  Ov to check thyroid or  Preventive visit .

## 2015-05-06 ENCOUNTER — Ambulatory Visit (INDEPENDENT_AMBULATORY_CARE_PROVIDER_SITE_OTHER): Payer: BLUE CROSS/BLUE SHIELD | Admitting: Adult Health

## 2015-05-06 ENCOUNTER — Encounter: Payer: Self-pay | Admitting: Adult Health

## 2015-05-06 VITALS — BP 124/90 | Temp 98.8°F | Ht 65.5 in | Wt 166.8 lb

## 2015-05-06 DIAGNOSIS — J0100 Acute maxillary sinusitis, unspecified: Secondary | ICD-10-CM | POA: Diagnosis not present

## 2015-05-06 DIAGNOSIS — J029 Acute pharyngitis, unspecified: Secondary | ICD-10-CM

## 2015-05-06 MED ORDER — DOXYCYCLINE HYCLATE 100 MG PO CAPS: 100.0000 mg | ORAL_CAPSULE | Freq: Two times a day (BID) | ORAL | Status: DC

## 2015-05-06 MED ORDER — MAGIC MOUTHWASH W/LIDOCAINE: 5.0000 mL | Freq: Three times a day (TID) | ORAL | Status: DC | PRN

## 2015-05-06 NOTE — Progress Notes (Signed)
Subjective:    Patient ID: Alison Holloway, female    DOB: 1961-06-15, 54 y.o.   MRN: [id number]  HPI 54 year old female who presents to the office today for sinusitis like symptoms. She endorses sinus pain and pressure, low grade fever, sore throat, headache and left sided ear pain x 2 weeks. The symptoms became better for a " couple of days but then came back and are worse then ever.   Denies n/v/d. Her husband has the same issues as she does. Has been using OTC cough and sinus medication which helps slightly.   Review of Systems  Constitutional: Positive for fever.  HENT: Positive for congestion, ear pain, postnasal drip, rhinorrhea, sinus pressure and sore throat. Negative for tinnitus and trouble swallowing.   Eyes: Negative.   Respiratory: Negative.   Cardiovascular: Negative.   Gastrointestinal: Negative.   Musculoskeletal: Negative.   Neurological: Positive for headaches. Negative for dizziness and light-headedness.  Hematological: Negative.   Psychiatric/Behavioral: Negative.    Past Medical History  Diagnosis Date  . Hyperthyroidism   . Endometriosis     Social History   Social History  . Marital Status: Married    Spouse Name: N/A  . Number of Children: N/A  . Years of Education: N/A   Occupational History  . Not on file.   Social History Main Topics  . Smoking status: Former Games developer  . Smokeless tobacco: Never Used  . Alcohol Use: 0.0 oz/week    0 Standard drinks or equivalent per week  . Drug Use: No  . Sexual Activity:    Partners: Male   Other Topics Concern  . Not on file   Social History Narrative   7 hours of sleep per night   Lives with her husband and son  h h of 3     Currently not working   1 dog in the home.   g2 p2    Soc: etoh  No tobacco .     Past Surgical History  Procedure Laterality Date  . Laparoscopy      x2    Family History  Problem Relation Age of Onset  . Stroke Mother   . Hypothyroidism Mother   . Hypertension  Mother   . Hypertension Father   . Depression Brother   . Colon cancer Brother     age 97     Allergies  Allergen Reactions  . Cephalexin     REACTION: rash  . Levothyroxine Sodium     REACTION: tachycardia patient says this is incorrect     Current Outpatient Prescriptions on File Prior to Visit  Medication Sig Dispense Refill  . doxylamine, Sleep, (UNISOM) 25 MG tablet Take 25 mg by mouth at bedtime as needed.     No current facility-administered medications on file prior to visit.    BP 124/90 mmHg  Temp(Src) 98.8 F (37.1 C) (Oral)  Ht 5' 5.5" (1.664 m)  Wt 166 lb 12.8 oz (75.66 kg)  BMI 27.32 kg/m2  LMP 12/23/2011       Objective:   Physical Exam  Constitutional: She is oriented to person, place, and time. She appears well-developed and well-nourished. No distress (tired).  HENT:  Head: Normocephalic and atraumatic.  Right Ear: External ear normal.  Left Ear: External ear normal.  Nose: Nose normal.  Mouth/Throat: Oropharynx is clear and moist. No oropharyngeal exudate.  Eyes: Right eye exhibits no discharge. Left eye exhibits no discharge.  Cardiovascular: Normal rate, regular rhythm,  normal heart sounds and intact distal pulses.  Exam reveals no gallop and no friction rub.   No murmur heard. Pulmonary/Chest: Effort normal and breath sounds normal. No respiratory distress. She has no wheezes. She has no rales. She exhibits no tenderness.  Musculoskeletal: Normal range of motion. She exhibits no edema or tenderness.  Neurological: She is alert and oriented to person, place, and time.  Skin: Skin is warm and dry. No rash noted. She is not diaphoretic. No erythema. No pallor.  Psychiatric: She has a normal mood and affect. Her behavior is normal. Judgment and thought content normal.  Nursing note and vitals reviewed.      Assessment & Plan:  1. Acute maxillary sinusitis, recurrence not specified - doxycycline (VIBRAMYCIN) 100 MG capsule; Take 1 capsule (100  mg total) by mouth 2 (two) times daily.  Dispense: 14 capsule; Refill: 0 - Follow up if no improvement 2-3 days or sooner if symptoms get worse  2. Sore throat - No exudate or inflammation/swelling.  - magic mouthwash w/lidocaine SOLN; Take 5 mLs by mouth 3 (three) times daily as needed for mouth pain.  Dispense: 50 mL; Refill: 0 - Warm salt water gargle and/or honey

## 2015-05-06 NOTE — Progress Notes (Signed)
Pre visit review using our clinic review tool, if applicable. No additional management support is needed unless otherwise documented below in the visit note. 

## 2015-05-06 NOTE — Patient Instructions (Addendum)
It was great meeting you today, I am sorry you are feeling so bad.   Please take the doxycycline twice a day for 7 days.   Use the magic mouth wash ( gargle and spit) three times a day as needed for sore throat. You can also gargle with warm salt water or eat a few teaspoons of honey.   Follow up if no improvement in 2-3 days.   Sinusitis Sinusitis is redness, soreness, and inflammation of the paranasal sinuses. Paranasal sinuses are air pockets within the bones of your face (beneath the eyes, the middle of the forehead, or above the eyes). In healthy paranasal sinuses, mucus is able to drain out, and air is able to circulate through them by way of your nose. However, when your paranasal sinuses are inflamed, mucus and air can become trapped. This can allow bacteria and other germs to grow and cause infection. Sinusitis can develop quickly and last only a short time (acute) or continue over a long period (chronic). Sinusitis that lasts for more than 12 weeks is considered chronic.  CAUSES  Causes of sinusitis include:  Allergies.  Structural abnormalities, such as displacement of the cartilage that separates your nostrils (deviated septum), which can decrease the air flow through your nose and sinuses and affect sinus drainage.  Functional abnormalities, such as when the small hairs (cilia) that line your sinuses and help remove mucus do not work properly or are not present. SIGNS AND SYMPTOMS  Symptoms of acute and chronic sinusitis are the same. The primary symptoms are pain and pressure around the affected sinuses. Other symptoms include:  Upper toothache.  Earache.  Headache.  Bad breath.  Decreased sense of smell and taste.  A cough, which worsens when you are lying flat.  Fatigue.  Fever.  Thick drainage from your nose, which often is green and may contain pus (purulent).  Swelling and warmth over the affected sinuses. DIAGNOSIS  Your health care provider will perform  a physical exam. During the exam, your health care provider may:  Look in your nose for signs of abnormal growths in your nostrils (nasal polyps).  Tap over the affected sinus to check for signs of infection.  View the inside of your sinuses (endoscopy) using an imaging device that has a light attached (endoscope). If your health care provider suspects that you have chronic sinusitis, one or more of the following tests may be recommended:  Allergy tests.  Nasal culture. A sample of mucus is taken from your nose, sent to a lab, and screened for bacteria.  Nasal cytology. A sample of mucus is taken from your nose and examined by your health care provider to determine if your sinusitis is related to an allergy. TREATMENT  Most cases of acute sinusitis are related to a viral infection and will resolve on their own within 10 days. Sometimes medicines are prescribed to help relieve symptoms (pain medicine, decongestants, nasal steroid sprays, or saline sprays).  However, for sinusitis related to a bacterial infection, your health care provider will prescribe antibiotic medicines. These are medicines that will help kill the bacteria causing the infection.  Rarely, sinusitis is caused by a fungal infection. In theses cases, your health care provider will prescribe antifungal medicine. For some cases of chronic sinusitis, surgery is needed. Generally, these are cases in which sinusitis recurs more than 3 times per year, despite other treatments. HOME CARE INSTRUCTIONS   Drink plenty of water. Water helps thin the mucus so your sinuses can  drain more easily.  Use a humidifier.  Inhale steam 3 to 4 times a day (for example, sit in the bathroom with the shower running).  Apply a warm, moist washcloth to your face 3 to 4 times a day, or as directed by your health care provider.  Use saline nasal sprays to help moisten and clean your sinuses.  Take medicines only as directed by your health care  provider.  If you were prescribed either an antibiotic or antifungal medicine, finish it all even if you start to feel better. SEEK IMMEDIATE MEDICAL CARE IF:  You have increasing pain or severe headaches.  You have nausea, vomiting, or drowsiness.  You have swelling around your face.  You have vision problems.  You have a stiff neck.  You have difficulty breathing. MAKE SURE YOU:   Understand these instructions.  Will watch your condition.  Will get help right away if you are not doing well or get worse. Document Released: 08/10/2005 Document Revised: 12/25/2013 Document Reviewed: 08/25/2011 [facility] Patient Information 2015 [place], [place]. This information is not intended to replace advice given to you by your health care provider. Make sure you discuss any questions you have with your health care provider.

## 2015-05-22 ENCOUNTER — Ambulatory Visit: Payer: BC Managed Care – PPO | Admitting: Internal Medicine

## 2015-06-04 ENCOUNTER — Other Ambulatory Visit: Payer: BLUE CROSS/BLUE SHIELD

## 2015-06-14 ENCOUNTER — Other Ambulatory Visit (INDEPENDENT_AMBULATORY_CARE_PROVIDER_SITE_OTHER): Payer: BLUE CROSS/BLUE SHIELD

## 2015-06-14 DIAGNOSIS — E039 Hypothyroidism, unspecified: Secondary | ICD-10-CM

## 2015-06-14 DIAGNOSIS — I519 Heart disease, unspecified: Principal | ICD-10-CM

## 2015-06-14 LAB — TSH: TSH: 1.07 u[IU]/mL (ref 0.35–4.50)

## 2016-03-12 ENCOUNTER — Encounter: Payer: Self-pay | Admitting: Internal Medicine

## 2016-03-12 ENCOUNTER — Ambulatory Visit (INDEPENDENT_AMBULATORY_CARE_PROVIDER_SITE_OTHER): Payer: BLUE CROSS/BLUE SHIELD | Admitting: Internal Medicine

## 2016-03-12 VITALS — BP 122/82 | Temp 98.5°F | Wt 167.2 lb

## 2016-03-12 DIAGNOSIS — J0120 Acute ethmoidal sinusitis, unspecified: Secondary | ICD-10-CM | POA: Diagnosis not present

## 2016-03-12 DIAGNOSIS — J0100 Acute maxillary sinusitis, unspecified: Secondary | ICD-10-CM

## 2016-03-12 MED ORDER — DOXYCYCLINE HYCLATE 100 MG PO CAPS: 100.0000 mg | ORAL_CAPSULE | Freq: Two times a day (BID) | ORAL | Status: DC

## 2016-03-12 NOTE — Progress Notes (Signed)
Chief Complaint  Patient presents with  . Sinus Pressure/Pain  . Sore Throat  . Headache  . Nasal Congestion  . Ear Pain    HPI: Alison Holloway 55 y.o.  Comes in for sda gest sinus infection about once a year usually begin allergyl ike sniffles and then can become more severe usually right side   just began allergy meds .   For a week of sx . Onset like sniffles and allergy sx and then turns into  Extreme pain that requires a lto of    Compresses ibuprofen   No fever  .   Self rx  Zyrtec  And muycin sudafed .     Not regularly .   But now full blown no cough but pnd  And nasal sneezing   Usually responds to antibiotic and nasal spray  Not needed pred in the past.  No hx dx alelrgy but does get congesion and sneezint at times usually feb through summer times  ROS: See pertinent positives and negatives per HPI.  Past Medical History  Diagnosis Date  . Hyperthyroidism   . Endometriosis     Family History  Problem Relation Age of Onset  . Stroke Mother   . Hypothyroidism Mother   . Hypertension Mother   . Hypertension Father   . Depression Brother   . Colon cancer Brother     age 56     Social History   Social History  . Marital Status: Married    Spouse Name: N/A  . Number of Children: N/A  . Years of Education: N/A   Social History Main Topics  . Smoking status: Former Games developermoker  . Smokeless tobacco: Never Used  . Alcohol Use: 0.0 oz/week    0 Standard drinks or equivalent per week  . Drug Use: No  . Sexual Activity:    Partners: Male   Other Topics Concern  . None   Social History Narrative   7 hours of sleep per night   Lives with her husband and son  h h of 3     Currently not working   1 dog in the home.   g2 p2    Soc: etoh  No tobacco .     Outpatient Prescriptions Prior to Visit  Medication Sig Dispense Refill  . doxylamine, Sleep, (UNISOM) 25 MG tablet Take 25 mg by mouth at bedtime as needed.    . doxycycline (VIBRAMYCIN) 100 MG capsule Take 1  capsule (100 mg total) by mouth 2 (two) times daily. (Patient not taking: Reported on 03/12/2016) 14 capsule 0  . magic mouthwash w/lidocaine SOLN Take 5 mLs by mouth 3 (three) times daily as needed for mouth pain. 50 mL 0   No facility-administered medications prior to visit.     EXAM:  BP 122/82 mmHg  Temp(Src) 98.5 F (36.9 C) (Oral)  Wt 167 lb 3.2 oz (75.841 kg)  LMP 12/23/2011  Body mass index is 27.39 kg/(m^2).  GENERAL: vitals reviewed and listed above, alert, oriented, appears well hydrated and in no acute distress non toxic  No cough mild congestion HEENT: atraumatic, conjunctiva  clear, no obvious abnormalities on inspection of external nose and ears  Face tender maxilla and ethmoid frontal area on right  OP : no lesion edema or exudate  Mild redness on right  NECK: no obvious masses on inspection palpation  PSYCH: pleasant and cooperative, no obvious depression or anxiety  ASSESSMENT AND PLAN:  Discussed the following assessment  and plan:  Acute ethmoidal sinusitis, recurrence not specified - Plan: doxycycline (VIBRAMYCIN) 100 MG capsule  Acute maxillary sinusitis, recurrence not specified Poss underlying allergy  Reviewed prevnetion add nasal steroid saline etc  Any decongestants   Offered prednisone but pat thinks will get better with antibiotic and nose spray .    Expectant management. Can also try afrin for a few days .   -Patient advised to return or notify health care team  if symptoms worsen ,persist or new concerns arise.  Patient Instructions      Sinusitis, Adult Sinusitis is redness, soreness, and inflammation of the paranasal sinuses. Paranasal sinuses are air pockets within the bones of your face. They are located beneath your eyes, in the middle of your forehead, and above your eyes. In healthy paranasal sinuses, mucus is able to drain out, and air is able to circulate through them by way of your nose. However, when your paranasal sinuses are inflamed,  mucus and air can become trapped. This can allow bacteria and other germs to grow and cause infection. Sinusitis can develop quickly and last only a short time (acute) or continue over a long period (chronic). Sinusitis that lasts for more than 12 weeks is considered chronic. CAUSES Causes of sinusitis include:  Allergies.  Structural abnormalities, such as displacement of the cartilage that separates your nostrils (deviated septum), which can decrease the air flow through your nose and sinuses and affect sinus drainage.  Functional abnormalities, such as when the small hairs (cilia) that line your sinuses and help remove mucus do not work properly or are not present. SIGNS AND SYMPTOMS Symptoms of acute and chronic sinusitis are the same. The primary symptoms are pain and pressure around the affected sinuses. Other symptoms include:  Upper toothache.  Earache.  Headache.  Bad breath.  Decreased sense of smell and taste.  A cough, which worsens when you are lying flat.  Fatigue.  Fever.  Thick drainage from your nose, which often is green and may contain pus (purulent).  Swelling and warmth over the affected sinuses. DIAGNOSIS Your health care provider will perform a physical exam. During your exam, your health care provider may perform any of the following to help determine if you have acute sinusitis or chronic sinusitis:  Look in your nose for signs of abnormal growths in your nostrils (nasal polyps).  Tap over the affected sinus to check for signs of infection.  View the inside of your sinuses using an imaging device that has a light attached (endoscope). If your health care provider suspects that you have chronic sinusitis, one or more of the following tests may be recommended:  Allergy tests.  Nasal culture. A sample of mucus is taken from your nose, sent to a lab, and screened for bacteria.  Nasal cytology. A sample of mucus is taken from your nose and examined by  your health care provider to determine if your sinusitis is related to an allergy. TREATMENT Most cases of acute sinusitis are related to a viral infection and will resolve on their own within 10-14  days. Sometimes, medicines are prescribed to help relieve symptoms of both acute and chronic sinusitis. These may include pain medicines, decongestants, nasal steroid sprays, or saline sprays. However, for sinusitis related to a bacterial infection, your health care provider will prescribe antibiotic medicines. These are medicines that will help kill the bacteria causing the infection. Rarely, sinusitis is caused by a fungal infection. In these cases, your health care provider will prescribe  antifungal medicine. For some cases of chronic sinusitis, surgery is needed. Generally, these are cases in which sinusitis recurs more than 3 times per year, despite other treatments. HOME CARE INSTRUCTIONS  Drink plenty of water. Water helps thin the mucus so your sinuses can drain more easily.  Use a humidifier.  Inhale steam 3-4 times a day (for example, sit in the bathroom with the shower running).  Apply a warm, moist washcloth to your face 3-4 times a day, or as directed by your health care provider.  Use saline nasal sprays to help moisten and clean your sinuses.  Take medicines only as directed by your health care provider.  If you were prescribed either an antibiotic or antifungal medicine, finish it all even if you start to feel better. SEEK IMMEDIATE MEDICAL CARE IF:  You have increasing pain or severe headaches.  You have nausea, vomiting, or drowsiness.  You have swelling around your face.  You have vision problems.  You have a stiff neck.  You have difficulty breathing.   This information is not intended to replace advice given to you by your health care provider. Make sure you discuss any questions you have with your health care provider.   Document Released: 08/10/2005 Document  Revised: 08/31/2014 Document Reviewed: 08/25/2011 Elsevier Interactive Patient Education 2016 [employer].      [place]. Wilbert Hayashi M.D.

## 2016-03-12 NOTE — Patient Instructions (Addendum)
Sinusitis, Adult Sinusitis is redness, soreness, and inflammation of the paranasal sinuses. Paranasal sinuses are air pockets within the bones of your face. They are located beneath your eyes, in the middle of your forehead, and above your eyes. In healthy paranasal sinuses, mucus is able to drain out, and air is able to circulate through them by way of your nose. However, when your paranasal sinuses are inflamed, mucus and air can become trapped. This can allow bacteria and other germs to grow and cause infection. Sinusitis can develop quickly and last only a short time (acute) or continue over a long period (chronic). Sinusitis that lasts for more than 12 weeks is considered chronic. CAUSES Causes of sinusitis include:  Allergies.  Structural abnormalities, such as displacement of the cartilage that separates your nostrils (deviated septum), which can decrease the air flow through your nose and sinuses and affect sinus drainage.  Functional abnormalities, such as when the small hairs (cilia) that line your sinuses and help remove mucus do not work properly or are not present. SIGNS AND SYMPTOMS Symptoms of acute and chronic sinusitis are the same. The primary symptoms are pain and pressure around the affected sinuses. Other symptoms include:  Upper toothache.  Earache.  Headache.  Bad breath.  Decreased sense of smell and taste.  A cough, which worsens when you are lying flat.  Fatigue.  Fever.  Thick drainage from your nose, which often is green and may contain pus (purulent).  Swelling and warmth over the affected sinuses. DIAGNOSIS Your health care provider will perform a physical exam. During your exam, your health care provider may perform any of the following to help determine if you have acute sinusitis or chronic sinusitis:  Look in your nose for signs of abnormal growths in your nostrils (nasal polyps).  Tap over the affected sinus to check for signs of  infection.  View the inside of your sinuses using an imaging device that has a light attached (endoscope). If your health care provider suspects that you have chronic sinusitis, one or more of the following tests may be recommended:  Allergy tests.  Nasal culture. A sample of mucus is taken from your nose, sent to a lab, and screened for bacteria.  Nasal cytology. A sample of mucus is taken from your nose and examined by your health care provider to determine if your sinusitis is related to an allergy. TREATMENT Most cases of acute sinusitis are related to a viral infection and will resolve on their own within 10-14  days. Sometimes, medicines are prescribed to help relieve symptoms of both acute and chronic sinusitis. These may include pain medicines, decongestants, nasal steroid sprays, or saline sprays. However, for sinusitis related to a bacterial infection, your health care provider will prescribe antibiotic medicines. These are medicines that will help kill the bacteria causing the infection. Rarely, sinusitis is caused by a fungal infection. In these cases, your health care provider will prescribe antifungal medicine. For some cases of chronic sinusitis, surgery is needed. Generally, these are cases in which sinusitis recurs more than 3 times per year, despite other treatments. HOME CARE INSTRUCTIONS  Drink plenty of water. Water helps thin the mucus so your sinuses can drain more easily.  Use a humidifier.  Inhale steam 3-4 times a day (for example, sit in the bathroom with the shower running).  Apply a warm, moist washcloth to your face 3-4 times a day, or as directed by your health care provider.  Use saline  nasal sprays to help moisten and clean your sinuses.  Take medicines only as directed by your health care provider.  If you were prescribed either an antibiotic or antifungal medicine, finish it all even if you start to feel better. SEEK IMMEDIATE MEDICAL CARE IF:  You  have increasing pain or severe headaches.  You have nausea, vomiting, or drowsiness.  You have swelling around your face.  You have vision problems.  You have a stiff neck.  You have difficulty breathing.   This information is not intended to replace advice given to you by your health care provider. Make sure you discuss any questions you have with your health care provider.   Document Released: 08/10/2005 Document Revised: 08/31/2014 Document Reviewed: 08/25/2011 Elsevier Interactive Patient Education [employer]

## 2017-03-19 ENCOUNTER — Ambulatory Visit (INDEPENDENT_AMBULATORY_CARE_PROVIDER_SITE_OTHER): Payer: BLUE CROSS/BLUE SHIELD | Admitting: Internal Medicine

## 2017-03-19 ENCOUNTER — Ambulatory Visit: Payer: BLUE CROSS/BLUE SHIELD | Admitting: Internal Medicine

## 2017-03-19 ENCOUNTER — Encounter: Payer: Self-pay | Admitting: Internal Medicine

## 2017-03-19 VITALS — BP 132/90 | HR 70 | Temp 97.9°F | Wt 163.0 lb

## 2017-03-19 DIAGNOSIS — Z20818 Contact with and (suspected) exposure to other bacterial communicable diseases: Secondary | ICD-10-CM

## 2017-03-19 DIAGNOSIS — J029 Acute pharyngitis, unspecified: Secondary | ICD-10-CM | POA: Diagnosis not present

## 2017-03-19 LAB — POCT RAPID STREP A (OFFICE): Rapid Strep A Screen: NEGATIVE

## 2017-03-19 MED ORDER — AMOXICILLIN 500 MG PO CAPS: 500.0000 mg | ORAL_CAPSULE | Freq: Two times a day (BID) | ORAL | 0 refills | Status: DC

## 2017-03-19 NOTE — Progress Notes (Signed)
Chief Complaint  Patient presents with  . Sore Throat    HPI: Alison Holloway 56 y.o.  sda   Last visit a year ago  For sinusitis  Is generally well.    Alison Holloway  Had pos strep tests  Alison under rx ,. Alison sore throat . Alison    Although Alison Holloway is well.   husaband advised getting checked To get checked    1-2 days   Sore throat Alison continues  Not that bad  But tired Alison ha today .  No cough ruhinorrhea   Very remote hx of strep as a child  Not Alison   Got sick rash with keflex when  Younger but can take amox but bothers Alison stomach  doenst take a lot of antiibiotnc  ROS: See pertinent positives Alison negatives per HPI.  Past Medical History:  Diagnosis Date  . Endometriosis   . Hyperthyroidism     Family History  Problem Relation Age of Onset  . Stroke Mother   . Hypothyroidism Mother   . Hypertension Mother   . Hypertension Father   . Depression Brother   . Colon cancer Brother        age 57     Social History   Social History  . Marital status: Married    Spouse name: N/A  . Number of children: N/A  . Years of education: N/A   Social History Main Topics  . Smoking status: Former Games developermoker  . Smokeless tobacco: Never Used  . Alcohol use 0.0 oz/week  . Drug use: No  . Sexual activity: Yes    Partners: Male   Other Topics Concern  . None   Social History Narrative   7 hours of sleep per night   Lives with Alison Holloway Alison Holloway  h h of 3     Currently not working   1 dog in the home.   g2 p2    Soc: etoh  No tobacco .     Outpatient Medications Prior to Visit  Medication Sig Dispense Refill  . doxylamine, Sleep, (UNISOM) 25 MG tablet Take 25 mg by mouth at bedtime as needed.    . doxycycline (VIBRAMYCIN) 100 MG capsule Take 1 capsule (100 mg total) by mouth 2 (two) times daily. 14 capsule 0   No facility-administered medications prior to visit.      EXAM:  BP 132/90 (BP Location: Left Arm, Patient Position: Sitting, Cuff Size: Normal)   Pulse 70   Temp  97.9 F (36.6 C) (Oral)   Wt 163 lb (73.9 kg)   LMP 12/23/2011   SpO2 98%   BMI 26.71 kg/m   Body mass index is 26.71 kg/m.  GENERAL: vitals reviewed Alison listed above, alert, oriented, appears well hydrated Alison in no acute distress HEENT: atraumatic, conjunctiva  clear, no obvious abnormalities on inspection of external nose Alison ears tmx clear OP : no lesion edema or exudate  Very minimal erythema  NECK: no obvious masses on inspection palpation  LUNGS: clear to auscultation bilaterally, no wheezes, rales or rhonchi, CV: HRRR, no clubbing cyanosis or  peripheral edema nl cap refill  MS: moves all extremities without noticeable focal  abnormality PSYCH: pleasant Alison cooperative,   ASSESSMENT Alison PLAN:  Discussed the following assessment Alison plan:  Sore throat - Plan: POCT rapid strep A, Culture, Group A Strep  Exposure to Streptococcal pharyngitis - Plan: POCT rapid strep A, Culture, Group A Strep prob not strep but  high risk exposures  Alison weekend  Culture pending optinos disscussed  -Patient advised to return or notify health care team  if symptoms worsen ,persist or new concerns arise.  Patient Instructions  Your  Exam is reassuring   Alison  Will wait for culture to come back . Rapid strep test is negative   If  Over the weekend   serious  pain  fever or  Swelling  Sore throat t without cough or runny nose  Over weekend can begin antibiotic .   Otherwise wait for culture to return .   Amoxicillin preferrred  Can take with  Probiotic to help avoid  Se . But since not allergic is the best rx  If  needed.      Alison Holloway.     Alison Holloway M.D.

## 2017-03-19 NOTE — Patient Instructions (Addendum)
Your  Exam is reassuring   And  Will wait for culture to come back . Rapid strep test is negative   If  Over the weekend   serious  pain  fever or  Swelling  Sore throat t without cough or runny nose  Over weekend can begin antibiotic .   Otherwise wait for culture to return .   Amoxicillin preferrred  Can take with  Probiotic to help avoid  Se . But since not allergic is the best rx  If  needed.      .Marland Kitchen

## 2017-03-21 LAB — CULTURE, GROUP A STREP

## 2017-05-14 ENCOUNTER — Encounter: Payer: Self-pay | Admitting: Internal Medicine

## 2017-06-17 ENCOUNTER — Telehealth: Payer: Self-pay | Admitting: Internal Medicine

## 2017-06-17 NOTE — Telephone Encounter (Signed)
Pt would like to have hep c screening. Please put order in system

## 2017-06-18 ENCOUNTER — Other Ambulatory Visit: Payer: Self-pay | Admitting: Emergency Medicine

## 2017-06-18 DIAGNOSIS — Z1159 Encounter for screening for other viral diseases: Secondary | ICD-10-CM

## 2017-06-18 NOTE — Telephone Encounter (Signed)
Ok

## 2017-06-18 NOTE — Telephone Encounter (Signed)
Order has been placed.

## 2017-06-18 NOTE — Telephone Encounter (Signed)
Please advise if okay to place orders.

## 2017-06-21 NOTE — Telephone Encounter (Signed)
lmom for pt to call back

## 2017-06-24 NOTE — Telephone Encounter (Signed)
lmom for pt to call back

## 2017-06-29 NOTE — Telephone Encounter (Signed)
lmom for pt to call back

## 2017-07-19 DIAGNOSIS — Z6825 Body mass index (BMI) 25.0-25.9, adult: Secondary | ICD-10-CM | POA: Diagnosis not present

## 2017-07-19 DIAGNOSIS — Z1231 Encounter for screening mammogram for malignant neoplasm of breast: Secondary | ICD-10-CM | POA: Diagnosis not present

## 2017-07-19 DIAGNOSIS — Z01419 Encounter for gynecological examination (general) (routine) without abnormal findings: Secondary | ICD-10-CM | POA: Diagnosis not present

## 2017-07-19 DIAGNOSIS — Z1151 Encounter for screening for human papillomavirus (HPV): Secondary | ICD-10-CM | POA: Diagnosis not present

## 2017-07-19 DIAGNOSIS — N841 Polyp of cervix uteri: Secondary | ICD-10-CM | POA: Diagnosis not present

## 2017-08-04 ENCOUNTER — Ambulatory Visit (INDEPENDENT_AMBULATORY_CARE_PROVIDER_SITE_OTHER): Payer: BLUE CROSS/BLUE SHIELD | Admitting: Family Medicine

## 2017-08-04 VITALS — BP 120/80 | HR 102 | Temp 98.9°F | Wt 149.9 lb

## 2017-08-04 DIAGNOSIS — N3001 Acute cystitis with hematuria: Secondary | ICD-10-CM

## 2017-08-04 DIAGNOSIS — R300 Dysuria: Secondary | ICD-10-CM | POA: Diagnosis not present

## 2017-08-04 DIAGNOSIS — B349 Viral infection, unspecified: Secondary | ICD-10-CM | POA: Diagnosis not present

## 2017-08-04 LAB — POCT URINALYSIS DIPSTICK
BILIRUBIN UA: NEGATIVE
GLUCOSE UA: NEGATIVE
Ketones, UA: POSITIVE
Nitrite, UA: NEGATIVE
Protein, UA: NEGATIVE
Spec Grav, UA: 1.02 (ref 1.010–1.025)
Urobilinogen, UA: 0.2 E.U./dL
pH, UA: 6 (ref 5.0–8.0)

## 2017-08-04 MED ORDER — FLUTICASONE PROPIONATE 50 MCG/ACT NA SUSP: 1.0000 | Freq: Every day | NASAL | 0 refills | Status: DC

## 2017-08-04 MED ORDER — SULFAMETHOXAZOLE-TRIMETHOPRIM 800-160 MG PO TABS: 1.0000 | ORAL_TABLET | Freq: Two times a day (BID) | ORAL | 0 refills | Status: AC

## 2017-08-04 NOTE — Patient Instructions (Addendum)
Viral Respiratory Infection A viral respiratory infection is an illness that affects parts of the body used for breathing, like the lungs, nose, and throat. It is caused by a germ called a virus. Some examples of this kind of infection are:  A cold.  The flu (influenza).  A respiratory syncytial virus (RSV) infection.  How do I know if I have this infection? Most of the time this infection causes:  A stuffy or runny nose.  Yellow or green fluid in the nose.  A cough.  Sneezing.  Tiredness (fatigue).  Achy muscles.  A sore throat.  Sweating or chills.  A fever.  A headache.  How is this infection treated? If the flu is diagnosed early, it may be treated with an antiviral medicine. This medicine shortens the length of time a person has symptoms. Symptoms may be treated with over-the-counter and prescription medicines, such as:  Expectorants. These make it easier to cough up mucus.  Decongestant nasal sprays.  Doctors do not prescribe antibiotic medicines for viral infections. They do not work with this kind of infection. How do I know if I should stay home? To keep others from getting sick, stay home if you have:  A fever.  A lasting cough.  A sore throat.  A runny nose.  Sneezing.  Muscles aches.  Headaches.  Tiredness.  Weakness.  Chills.  Sweating.  An upset stomach (nausea).  Follow these instructions at home:  Rest as much as possible.  Take over-the-counter and prescription medicines only as told by your doctor.  Drink enough fluid to keep your pee (urine) clear or pale yellow.  Gargle with salt water. Do this 3-4 times per day or as needed. To make a salt-water mixture, dissolve -1 tsp of salt in 1 cup of warm water. Make sure the salt dissolves all the way.  Use nose drops made from salt water. This helps with stuffiness (congestion). It also helps soften the skin around your nose.  Do not drink alcohol.  Do not use tobacco  products, including cigarettes, chewing tobacco, and e-cigarettes. If you need help quitting, ask your doctor. Get help if:  Your symptoms get worse over time.  You have a fever.  You have very bad pain in your face or forehead.  Parts of your jaw or neck become very swollen. Get help right away if:  You feel pain or pressure in your chest.  You have shortness of breath.  You faint or feel like you will faint.  You keep throwing up (vomiting).  You feel confused. This information is not intended to replace advice given to you by your health care provider. Make sure you discuss any questions you have with your health care provider. Document Released: 07/23/2008 Document Revised: 01/16/2016 Document Reviewed: 01/16/2015 Elsevier Interactive Patient Education  2018 [employer]  Urinary Tract Infection, Adult A urinary tract infection (UTI) is an infection of any part of the urinary tract, which includes the kidneys, ureters, bladder, and urethra. These organs make, store, and get rid of urine in the body. UTI can be a bladder infection (cystitis) or kidney infection (pyelonephritis). What are the causes? This infection may be caused by fungi, viruses, or bacteria. Bacteria are the most common cause of UTIs. This condition can also be caused by repeated incomplete emptying of the bladder during urination. What increases the risk? This condition is more likely to develop if:  You ignore your need to urinate or hold urine for long periods of  time.  You do not empty your bladder completely during urination.  You wipe back to front after urinating or having a bowel movement, if you are female.  You are uncircumcised, if you are female.  You are constipated.  You have a urinary catheter that stays in place (indwelling).  You have a weak defense (immune) system.  You have a medical condition that affects your bowels, kidneys, or bladder.  You have diabetes.  You take  antibiotic medicines frequently or for long periods of time, and the antibiotics no longer work well against certain types of infections (antibiotic resistance).  You take medicines that irritate your urinary tract.  You are exposed to chemicals that irritate your urinary tract.  You are female.  What are the signs or symptoms? Symptoms of this condition include:  Fever.  Frequent urination or passing small amounts of urine frequently.  Needing to urinate urgently.  Pain or burning with urination.  Urine that smells bad or unusual.  Cloudy urine.  Pain in the lower abdomen or back.  Trouble urinating.  Blood in the urine.  Vomiting or being less hungry than normal.  Diarrhea or abdominal pain.  Vaginal discharge, if you are female.  How is this diagnosed? This condition is diagnosed with a medical history and physical exam. You will also need to provide a urine sample to test your urine. Other tests may be done, including:  Blood tests.  Sexually transmitted disease (STD) testing.  If you have had more than one UTI, a cystoscopy or imaging studies may be done to determine the cause of the infections. How is this treated? Treatment for this condition often includes a combination of two or more of the following:  Antibiotic medicine.  Other medicines to treat less common causes of UTI.  Over-the-counter medicines to treat pain.  Drinking enough water to stay hydrated.  Follow these instructions at home:  Take over-the-counter and prescription medicines only as told by your health care provider.  If you were prescribed an antibiotic, take it as told by your health care provider. Do not stop taking the antibiotic even if you start to feel better.  Avoid alcohol, caffeine, tea, and carbonated beverages. They can irritate your bladder.  Drink enough fluid to keep your urine clear or pale yellow.  Keep all follow-up visits as told by your health care provider.  This is important.  Make sure to: ? Empty your bladder often and completely. Do not hold urine for long periods of time. ? Empty your bladder before and after sex. ? Wipe from front to back after a bowel movement if you are female. Use each tissue one time when you wipe. Contact a health care provider if:  You have back pain.  You have a fever.  You feel nauseous or vomit.  Your symptoms do not get better after 3 days.  Your symptoms go away and then return. Get help right away if:  You have severe back pain or lower abdominal pain.  You are vomiting and cannot keep down any medicines or water. This information is not intended to replace advice given to you by your health care provider. Make sure you discuss any questions you have with your health care provider. Document Released: 05/20/2005 Document Revised: 01/22/2016 Document Reviewed: 07/01/2015 Elsevier Interactive Patient Education  2017 [employer]

## 2017-08-04 NOTE — Progress Notes (Signed)
Subjective:    Patient ID: Alison MinkLisa P Holloway, female    DOB: 11/06/1960, 56 y.o.   MRN: [id number]  No chief complaint on file.   HPI Patient was seen today for acute concerns.  Pt's initial story confusing as she endorsed head pressure and HA with low grade fever x 1 wk, instead of 1 day.  Last Wednesday, pt had painful urination and noticed some blood in her urine, since resolved.  The next day she went to [place] and endorsed "upset" stomach and constipation.  These symptoms have continued.  Yesterday, pt states headache/head pressure, low-grade fever T-max 100, and nasal congestion started.  During this whole time pt has felt tired.  Past Medical History:  Diagnosis Date  . Endometriosis   . Hyperthyroidism     Allergies  Allergen Reactions  . Cephalexin     REACTION: rash  . Levothyroxine Sodium     REACTION: tachycardia patient says this is incorrect     ROS General: Denies fever, chills, night sweats, changes in weight, changes in appetite   +low grade fever, 100F HEENT: Denies ear pain, changes in vision, rhinorrhea, sore throat   +HA, nasal congestion, head pressure CV: Denies CP, palpitations, SOB, orthopnea Pulm: Denies SOB, cough, wheezing GI: Denies abdominal pain, nausea, vomiting, diarrhea,    +upset stomach, constipation  GU: Denies dysuria, hematuria, frequency, vaginal discharge Msk: Denies muscle cramps, joint pains Neuro: Denies weakness, numbness, tingling Skin: Denies rashes, bruising Psych: Denies depression, anxiety, hallucinations     Objective:    Blood pressure 120/80, pulse (!) 102, temperature 98.9 F (37.2 C), temperature source Oral, weight 149 lb 14.4 oz (68 kg), last menstrual period 12/23/2011, SpO2 97 %.   Gen. Pleasant, well-nourished, in no distress, normal affect   HEENT: [place]/AT, face symmetric, no scleral icterus, PERRLA, nares patent without drainage, pharynx with mild erythema and postnasal drainage, no exudate.  TMs normal, but full  appearing. Lungs: no accessory muscle use, CTAB, no wheezes or rales Cardiovascular: RRR, no m/r/g, no peripheral edema Abdomen: BS present, soft, NT/ND, no hepatosplenomegaly. Neuro:  A&Ox3, CN II-XII intact, normal gait   Wt Readings from Last 3 Encounters:  08/04/17 149 lb 14.4 oz (68 kg)  03/19/17 163 lb (73.9 kg)  03/12/16 167 lb 3.2 oz (75.8 kg)    Lab Results  Component Value Date   WBC 7.1 05/30/2012   HGB 13.0 05/30/2012   HCT 39.3 05/30/2012   PLT 230.0 05/30/2012   GLUCOSE 83 05/30/2012   CHOL 191 05/30/2012   TRIG 151.0 (H) 05/30/2012   HDL 52.00 05/30/2012   LDLCALC 109 (H) 05/30/2012   ALT 18 05/30/2012   AST 22 05/30/2012   NA 143 05/30/2012   K 4.5 05/30/2012   CL 107 05/30/2012   CREATININE 0.6 05/30/2012   BUN 11 05/30/2012   CO2 30 05/30/2012   TSH 1.07 06/14/2015    Assessment/Plan:  Viral illness -supportive care -given RTC precautions  Dysuria  - Plan: POCT urinalysis dipstick, Culture, Urine  Acute cystitis with hematuria  - Plan: sulfamethoxazole-trimethoprim (BACTRIM DS,SEPTRA DS) 800-160 MG tablet  F/u prn if not feeling better

## 2017-08-07 LAB — URINE CULTURE
MICRO NUMBER:: 81396999
SPECIMEN QUALITY: ADEQUATE

## 2017-08-09 ENCOUNTER — Encounter: Payer: Self-pay | Admitting: Family Medicine

## 2017-08-11 ENCOUNTER — Ambulatory Visit: Payer: BLUE CROSS/BLUE SHIELD | Admitting: Adult Health

## 2017-10-04 ENCOUNTER — Encounter: Payer: BLUE CROSS/BLUE SHIELD | Admitting: Internal Medicine

## 2017-11-11 ENCOUNTER — Encounter: Payer: BLUE CROSS/BLUE SHIELD | Admitting: Internal Medicine

## 2017-12-02 NOTE — Progress Notes (Signed)
Chief Complaint  Patient presents with  . Annual Exam    Pt c/o increased fatigue and weight loss. Pt reuqesting her labs to check for this. // Feel she may have a UTI, burning with urination and frequency.     HPI: Patient  Alison Holloway  57 y.o. comes in today for Preventive Health Care visit  1. See above   Lose 30 # since  Last summer  Eating less    Hx uti today ok but had sx over a weekend    Sees gyne and utd  gets   Ob once a year . Says no other specialist   Ex tobacco    Finally got the weight back off   Health Maintenance  Topic Date Due  . Hepatitis C Screening  May 03, 1961  . HIV Screening  08/16/1975  . COLONOSCOPY  08/15/2010  . INFLUENZA VACCINE  03/24/2018  . MAMMOGRAM  05/25/2019  . TETANUS/TDAP  02/13/2020  . PAP SMEAR  05/24/2020   Health Maintenance Review LIFESTYLE:  Exercise:  Active no reg exercise  Tobacco/ETS: husband  Out  Rare ets.  Alcohol:   One a month  Sugar beverages:  No  Sleep:  7 hours  Drug use: no HH of   3 no pets  Work:  No scheduled.     ROS:  nsee hpi  GEN/ HEENT: No fever, significant weight changes sweats headaches vision problems hearing changes, CV/ PULM; No chest pain shortness of breath cough, syncope,edema  change in exercise tolerance. GI /GU: No adominal pain, vomiting, change in bowel habits. No blood in the stool. SKIN/HEME: ,no acute skin rashes suspicious lesions or bleeding. No lymphadenopathy, nodules, masses.  NEURO/ PSYCH:  No neurologic signs such as weakness numbness. No depression anxiety. IMM/ Allergy: No unusual infections.  Allergy .   REST of 12 system review negative except as per HPI   Past Medical History:  Diagnosis Date  . Endometriosis   . Hyperthyroidism     Past Surgical History:  Procedure Laterality Date  . LAPAROSCOPY     x2    Family History  Problem Relation Age of Onset  . Stroke Mother   . Hypothyroidism Mother   . Hypertension Mother   . Hypertension Father   .  Depression Brother   . Colon cancer Brother        age 57     Social History   Socioeconomic History  . Marital status: Married    Spouse name: Not on file  . Number of children: Not on file  . Years of education: Not on file  . Highest education level: Not on file  Occupational History  . Not on file  Social Needs  . Financial resource strain: Not on file  . Food insecurity:    Worry: Not on file    Inability: Not on file  . Transportation needs:    Medical: Not on file    Non-medical: Not on file  Tobacco Use  . Smoking status: Former Research scientist (life sciences)  . Smokeless tobacco: Never Used  Substance and Sexual Activity  . Alcohol use: Yes    Alcohol/week: 0.0 oz  . Drug use: No  . Sexual activity: Yes    Partners: Male  Lifestyle  . Physical activity:    Days per week: Not on file    Minutes per session: Not on file  . Stress: Not on file  Relationships  . Social connections:    Talks on phone:  Not on file    Gets together: Not on file    Attends religious service: Not on file    Active member of club or organization: Not on file    Attends meetings of clubs or organizations: Not on file    Relationship status: Not on file  Other Topics Concern  . Not on file  Social History Narrative   7 hours of sleep per night   Lives with her husband and son  h h of 3     Currently not working   1 dog in the home.   g2 p2    Soc: etoh  No tobacco .     Outpatient Medications Prior to Visit  Medication Sig Dispense Refill  . loratadine (CLARITIN) 10 MG tablet Take 10 mg by mouth daily.    . fluticasone (FLONASE) 50 MCG/ACT nasal spray Place 1 spray into both nostrils daily. (Patient not taking: Reported on 12/06/2017) 16 g 0  . doxylamine, Sleep, (UNISOM) 25 MG tablet Take 25 mg by mouth at bedtime as needed.     No facility-administered medications prior to visit.      EXAM:  BP 102/70 (BP Location: Right Arm, Patient Position: Sitting, Cuff Size: Normal)   Pulse 78   Temp  98 F (36.7 C) (Oral)   Ht 5' 5.5" (1.664 m)   Wt 132 lb 11.2 oz (60.2 kg)   LMP 12/23/2011   BMI 21.75 kg/m   Body mass index is 21.75 kg/m. Wt Readings from Last 3 Encounters:  12/06/17 132 lb 11.2 oz (60.2 kg)  08/04/17 149 lb 14.4 oz (68 kg)  03/19/17 163 lb (73.9 kg)    Physical Exam: Vital signs reviewed KWI:OXBD is a well-developed well-nourished alert cooperative    who appearsr stated age in no acute distress.  HEENT: normocephalic atraumatic , Eyes: PERRL EOM's full, conjunctiva clear, Nares: paten,t no deformity discharge or tenderness., Ears: no deformity EAC's clear TMs with normal landmarks. Mouth: clear OP, no lesions, edema.  Moist mucous membranes. Dentition in adequate repair. NECK: supple without masses, thyromegaly or bruits. CHEST/PULM:  Clear to auscultation and percussion breath sounds equal no wheeze , rales or rhonchi. No chest wall deformities or tenderness. Breast: normal by inspection . No dimpling, discharge, masses, tenderness or discharge . CV: PMI is nondisplaced, S1 S2 no gallops, murmurs, rubs. Peripheral pulses are full without delay.No JVD .  ABDOMEN: Bowel sounds normal nontender  No guard or rebound, no hepato splenomegal no CVA tenderness.  No hernia. Extremtities:  No clubbing cyanosis or edema, no acute joint swelling or redness no focal atrophy NEURO:  Oriented x3, cranial nerves 3-12 appear to be intact, no obvious focal weakness,gait within normal limits no abnormal reflexes or asymmetrical SKIN: No acute rashes normal turgor, color, no bruising or petechiae. PSYCH: Oriented, good eye contact, no obvious depression anxiety, cognition and judgment appear normal. LN: no cervical axillary inguinal adenopathy  Lab Results  Component Value Date   WBC 8.3 12/06/2017   HGB 12.9 12/06/2017   HCT 38.2 12/06/2017   PLT 206.0 12/06/2017   GLUCOSE 84 12/06/2017   CHOL 157 12/06/2017   TRIG 72.0 12/06/2017   HDL 64.40 12/06/2017   LDLCALC 78  12/06/2017   ALT 18 12/06/2017   AST 17 12/06/2017   NA 142 12/06/2017   K 4.1 12/06/2017   CL 105 12/06/2017   CREATININE 0.58 12/06/2017   BUN 11 12/06/2017   CO2 29 12/06/2017   TSH 1.61  12/06/2017    BP Readings from Last 3 Encounters:  12/06/17 102/70  08/04/17 120/80  03/19/17 132/90   Wt Readings from Last 3 Encounters:  12/06/17 132 lb 11.2 oz (60.2 kg)  08/04/17 149 lb 14.4 oz (68 kg)  03/19/17 163 lb (73.9 kg)     Lab results reviewed with patient   ASSESSMENT AND PLAN:  Discussed the following assessment and plan:  Visit for preventive health examination - Plan: Basic metabolic panel, CBC with Differential/Platelet, Hepatic function panel, Lipid panel, TSH  Frequent urination - Plan: POC Urinalysis Dipstick, Basic metabolic panel, CBC with Differential/Platelet, Hepatic function panel, Lipid panel, TSH  Burning with urination - Plan: POC Urinalysis Dipstick, Basic metabolic panel, CBC with Differential/Platelet, Hepatic function panel, Lipid panel, TSH  Hyperlipidemia, unspecified hyperlipidemia type - Plan: Basic metabolic panel, CBC with Differential/Platelet, Hepatic function panel, Lipid panel, TSH  Need for hepatitis C screening test - Plan: Hepatitis C antibody  Screen for colon cancer - Plan: Fecal occult blood, imunochemical  H/O Graves' disease - Plan: T4, free Urine clear today    By ds    If sx return call and we can repeat ua and do u cx if needed   Or ov as needed After pat left noted   mng noted  With bs 2015 and advised fu 1 year   But she has done well.   Follow  Patient Care Team: Panosh, Standley Brooking, MD as PCP - General (Internal Medicine) Princess Bruins, MD as Consulting Physician (Obstetrics and Gynecology) Macarthur Critchley, [place] as Referring Physician (Optometry) Philemon Kingdom, MD as Consulting Physician (Internal Medicine) Patient Instructions  Check into cologuard  As a screening for colon  Cancer screening .  Otherwise    Yearly stool  tests  Or colonscopy.  Shingles vaccine when available.  Continue lifestyle intervention healthy eating and exercise . If urine sx recur then contact us for recheck urine tests.   Health Maintenance, Female Adopting a healthy lifestyle and getting preventive care can go a long way to promote health and wellness. Talk with your health care provider about what schedule of regular examinations is right for you. This is a good chance for you to check in with your provider about disease prevention and staying healthy. In between checkups, there are plenty of things you can do on your own. Experts have done a lot of research about which lifestyle changes and preventive measures are most likely to keep you healthy. Ask your health care provider for more information. Weight and diet Eat a healthy diet  Be sure to include plenty of vegetables, fruits, low-fat dairy products, and lean protein.  Do not eat a lot of foods high in solid fats, added sugars, or salt.  Get regular exercise. This is one of the most important things you can do for your health. ? Most adults should exercise for at least 150 minutes each week. The exercise should increase your heart rate and make you sweat (moderate-intensity exercise). ? Most adults should also do strengthening exercises at least twice a week. This is in addition to the moderate-intensity exercise.  Maintain a healthy weight  Body mass index (BMI) is a measurement that can be used to identify possible weight problems. It estimates body fat based on height and weight. Your health care provider can help determine your BMI and help you achieve or maintain a healthy weight.  For females 58 years of age and older: ? A BMI below 18.5 is considered underweight. ?  A BMI of 18.5 to 24.9 is normal. ? A BMI of 25 to 29.9 is considered overweight. ? A BMI of 30 and above is considered obese.  Watch levels of cholesterol and blood lipids  You should start having  your blood tested for lipids and cholesterol at 58 years of age, then have this test every 5 years.  You may need to have your cholesterol levels checked more often if: ? Your lipid or cholesterol levels are high. ? You are older than 58 years of age. ? You are at high risk for heart disease.  Cancer screening Lung Cancer  Lung cancer screening is recommended for adults 55-80 years old who are at high risk for lung cancer because of a history of smoking.  A yearly low-dose CT scan of the lungs is recommended for people who: ? Currently smoke. ? Have quit within the past 15 years. ? Have at least a 30-pack-year history of smoking. A pack year is smoking an average of one pack of cigarettes a day for 1 year.  Yearly screening should continue until it has been 15 years since you quit.  Yearly screening should stop if you develop a health problem that would prevent you from having lung cancer treatment.  Breast Cancer  Practice breast self-awareness. This means understanding how your breasts normally appear and feel.  It also means doing regular breast self-exams. Let your health care provider know about any changes, no matter how small.  If you are in your 20s or 30s, you should have a clinical breast exam (CBE) by a health care provider every 1-3 years as part of a regular health exam.  If you are 40 or older, have a CBE every year. Also consider having a breast X-ray (mammogram) every year.  If you have a family history of breast cancer, talk to your health care provider about genetic screening.  If you are at high risk for breast cancer, talk to your health care provider about having an MRI and a mammogram every year.  Breast cancer gene (BRCA) assessment is recommended for women who have family members with BRCA-related cancers. BRCA-related cancers include: ? Breast. ? Ovarian. ? Tubal. ? Peritoneal cancers.  Results of the assessment will determine the need for genetic  counseling and BRCA1 and BRCA2 testing.  Cervical Cancer Your health care provider may recommend that you be screened regularly for cancer of the pelvic organs (ovaries, uterus, and vagina). This screening involves a pelvic examination, including checking for microscopic changes to the surface of your cervix (Pap test). You may be encouraged to have this screening done every 3 years, beginning at age 21.  For women ages 30-65, health care providers may recommend pelvic exams and Pap testing every 3 years, or they may recommend the Pap and pelvic exam, combined with testing for human papilloma virus (HPV), every 5 years. Some types of HPV increase your risk of cervical cancer. Testing for HPV may also be done on women of any age with unclear Pap test results.  Other health care providers may not recommend any screening for nonpregnant women who are considered low risk for pelvic cancer and who do not have symptoms. Ask your health care provider if a screening pelvic exam is right for you.  If you have had past treatment for cervical cancer or a condition that could lead to cancer, you need Pap tests and screening for cancer for at least 20 years after your treatment. If Pap tests have   been discontinued, your risk factors (such as having a new sexual partner) need to be reassessed to determine if screening should resume. Some women have medical problems that increase the chance of getting cervical cancer. In these cases, your health care provider may recommend more frequent screening and Pap tests.  Colorectal Cancer  This type of cancer can be detected and often prevented.  Routine colorectal cancer screening usually begins at 58 years of age and continues through 58 years of age.  Your health care provider may recommend screening at an earlier age if you have risk factors for colon cancer.  Your health care provider may also recommend using home test kits to check for hidden blood in the  stool.  A small camera at the end of a tube can be used to examine your colon directly (sigmoidoscopy or colonoscopy). This is done to check for the earliest forms of colorectal cancer.  Routine screening usually begins at age 50.  Direct examination of the colon should be repeated every 5-10 years through 58 years of age. However, you may need to be screened more often if early forms of precancerous polyps or small growths are found.  Skin Cancer  Check your skin from head to toe regularly.  Tell your health care provider about any new moles or changes in moles, especially if there is a change in a mole's shape or color.  Also tell your health care provider if you have a mole that is larger than the size of a pencil eraser.  Always use sunscreen. Apply sunscreen liberally and repeatedly throughout the day.  Protect yourself by wearing long sleeves, pants, a wide-brimmed hat, and sunglasses whenever you are outside.  Heart disease, diabetes, and high blood pressure  High blood pressure causes heart disease and increases the risk of stroke. High blood pressure is more likely to develop in: ? People who have blood pressure in the high end of the normal range (130-139/85-89 mm Hg). ? People who are overweight or obese. ? People who are African American.  If you are 18-39 years of age, have your blood pressure checked every 3-5 years. If you are 40 years of age or older, have your blood pressure checked every year. You should have your blood pressure measured twice-once when you are at a hospital or clinic, and once when you are not at a hospital or clinic. Record the average of the two measurements. To check your blood pressure when you are not at a hospital or clinic, you can use: ? An automated blood pressure machine at a pharmacy. ? A home blood pressure monitor.  If you are between 55 years and 79 years old, ask your health care provider if you should take aspirin to prevent  strokes.  Have regular diabetes screenings. This involves taking a blood sample to check your fasting blood sugar level. ? If you are at a normal weight and have a low risk for diabetes, have this test once every three years after 58 years of age. ? If you are overweight and have a high risk for diabetes, consider being tested at a younger age or more often. Preventing infection Hepatitis B  If you have a higher risk for hepatitis B, you should be screened for this virus. You are considered at high risk for hepatitis B if: ? You were born in a country where hepatitis B is common. Ask your health care provider which countries are considered high risk. ? Your parents were born   in a high-risk country, and you have not been immunized against hepatitis B (hepatitis B vaccine). ? You have HIV or AIDS. ? You use needles to inject street drugs. ? You live with someone who has hepatitis B. ? You have had sex with someone who has hepatitis B. ? You get hemodialysis treatment. ? You take certain medicines for conditions, including cancer, organ transplantation, and autoimmune conditions.  Hepatitis C  Blood testing is recommended for: ? Everyone born from [birth year] through 1966. ? Anyone with known risk factors for hepatitis C.  Sexually transmitted infections (STIs)  You should be screened for sexually transmitted infections (STIs) including gonorrhea and chlamydia if: ? You are sexually active and are younger than 58 years of age. ? You are older than 58 years of age and your health care provider tells you that you are at risk for this type of infection. ? Your sexual activity has changed since you were last screened and you are at an increased risk for chlamydia or gonorrhea. Ask your health care provider if you are at risk.  If you do not have HIV, but are at risk, it may be recommended that you take a prescription medicine daily to prevent HIV infection. This is called pre-exposure prophylaxis  (PrEP). You are considered at risk if: ? You are sexually active and do not regularly use condoms or know the HIV status of your partner(s). ? You take drugs by injection. ? You are sexually active with a partner who has HIV.  Talk with your health care provider about whether you are at high risk of being infected with HIV. If you choose to begin PrEP, you should first be tested for HIV. You should then be tested every 3 months for as long as you are taking PrEP. Pregnancy  If you are premenopausal and you may become pregnant, ask your health care provider about preconception counseling.  If you may become pregnant, take 400 to 800 micrograms (mcg) of folic acid every day.  If you want to prevent pregnancy, talk to your health care provider about birth control (contraception). Osteoporosis and menopause  Osteoporosis is a disease in which the bones lose minerals and strength with aging. This can result in serious bone fractures. Your risk for osteoporosis can be identified using a bone density scan.  If you are 35 years of age or older, or if you are at risk for osteoporosis and fractures, ask your health care provider if you should be screened.  Ask your health care provider whether you should take a calcium or vitamin D supplement to lower your risk for osteoporosis.  Menopause may have certain physical symptoms and risks.  Hormone replacement therapy may reduce some of these symptoms and risks. Talk to your health care provider about whether hormone replacement therapy is right for you. Follow these instructions at home:  Schedule regular health, dental, and eye exams.  Stay current with your immunizations.  Do not use any tobacco products including cigarettes, chewing tobacco, or electronic cigarettes.  If you are pregnant, do not drink alcohol.  If you are breastfeeding, limit how much and how often you drink alcohol.  Limit alcohol intake to no more than 1 drink per day for  nonpregnant women. One drink equals 12 ounces of beer, 5 ounces of wine, or 1 ounces of hard liquor.  Do not use street drugs.  Do not share needles.  Ask your health care provider for help if you need support or information  about quitting drugs.  Tell your health care provider if you often feel depressed.  Tell your health care provider if you have ever been abused or do not feel safe at home. This information is not intended to replace advice given to you by your health care provider. Make sure you discuss any questions you have with your health care provider. Document Released: 02/23/2011 Document Revised: 01/16/2016 Document Reviewed: 05/14/2015 [employer]vier Interactive Patient Education  2018 [employer]     Wanda K. Panosh M.D.  

## 2017-12-06 ENCOUNTER — Encounter: Payer: Self-pay | Admitting: Internal Medicine

## 2017-12-06 ENCOUNTER — Ambulatory Visit (INDEPENDENT_AMBULATORY_CARE_PROVIDER_SITE_OTHER): Payer: BLUE CROSS/BLUE SHIELD | Admitting: Internal Medicine

## 2017-12-06 VITALS — BP 102/70 | HR 78 | Temp 98.0°F | Ht 65.5 in | Wt 132.7 lb

## 2017-12-06 DIAGNOSIS — Z0000 Encounter for general adult medical examination without abnormal findings: Secondary | ICD-10-CM | POA: Diagnosis not present

## 2017-12-06 DIAGNOSIS — E785 Hyperlipidemia, unspecified: Secondary | ICD-10-CM | POA: Diagnosis not present

## 2017-12-06 DIAGNOSIS — Z1211 Encounter for screening for malignant neoplasm of colon: Secondary | ICD-10-CM

## 2017-12-06 DIAGNOSIS — Z1159 Encounter for screening for other viral diseases: Secondary | ICD-10-CM | POA: Diagnosis not present

## 2017-12-06 DIAGNOSIS — Z8639 Personal history of other endocrine, nutritional and metabolic disease: Secondary | ICD-10-CM

## 2017-12-06 DIAGNOSIS — R300 Dysuria: Secondary | ICD-10-CM | POA: Diagnosis not present

## 2017-12-06 DIAGNOSIS — R350 Frequency of micturition: Secondary | ICD-10-CM | POA: Diagnosis not present

## 2017-12-06 LAB — HEPATIC FUNCTION PANEL
ALK PHOS: 69 U/L (ref 39–117)
ALT: 18 U/L (ref 0–35)
AST: 17 U/L (ref 0–37)
Albumin: 4.3 g/dL (ref 3.5–5.2)
BILIRUBIN TOTAL: 0.7 mg/dL (ref 0.2–1.2)
Bilirubin, Direct: 0.1 mg/dL (ref 0.0–0.3)
Total Protein: 7 g/dL (ref 6.0–8.3)

## 2017-12-06 LAB — CBC WITH DIFFERENTIAL/PLATELET
BASOS ABS: 0 10*3/uL (ref 0.0–0.1)
Basophils Relative: 0.5 % (ref 0.0–3.0)
Eosinophils Absolute: 0 10*3/uL (ref 0.0–0.7)
Eosinophils Relative: 0.6 % (ref 0.0–5.0)
HCT: 38.2 % (ref 36.0–46.0)
Hemoglobin: 12.9 g/dL (ref 12.0–15.0)
Lymphocytes Relative: 20.4 % (ref 12.0–46.0)
Lymphs Abs: 1.7 10*3/uL (ref 0.7–4.0)
MCHC: 33.8 g/dL (ref 30.0–36.0)
MCV: 94.2 fl (ref 78.0–100.0)
MONO ABS: 0.5 10*3/uL (ref 0.1–1.0)
Monocytes Relative: 6.3 % (ref 3.0–12.0)
NEUTROS ABS: 6 10*3/uL (ref 1.4–7.7)
NEUTROS PCT: 72.2 % (ref 43.0–77.0)
PLATELETS: 206 10*3/uL (ref 150.0–400.0)
RBC: 4.05 Mil/uL (ref 3.87–5.11)
RDW: 13.1 % (ref 11.5–15.5)
WBC: 8.3 10*3/uL (ref 4.0–10.5)

## 2017-12-06 LAB — BASIC METABOLIC PANEL
BUN: 11 mg/dL (ref 6–23)
CALCIUM: 9.7 mg/dL (ref 8.4–10.5)
CO2: 29 meq/L (ref 19–32)
CREATININE: 0.58 mg/dL (ref 0.40–1.20)
Chloride: 105 mEq/L (ref 96–112)
GFR: 113.76 mL/min (ref >60.00)
Glucose, Bld: 84 mg/dL (ref 70–99)
Potassium: 4.1 mEq/L (ref 3.5–5.1)
SODIUM: 142 meq/L (ref 135–145)

## 2017-12-06 LAB — POCT URINALYSIS DIPSTICK
BILIRUBIN UA: NEGATIVE
GLUCOSE UA: NEGATIVE
KETONES UA: NEGATIVE
LEUKOCYTES UA: NEGATIVE
Nitrite, UA: NEGATIVE
Protein, UA: NEGATIVE
RBC UA: NEGATIVE
Urobilinogen, UA: 0.2 E.U./dL
pH, UA: 6 (ref 5.0–8.0)

## 2017-12-06 LAB — LIPID PANEL
CHOLESTEROL: 157 mg/dL (ref 0–200)
HDL: 64.4 mg/dL (ref >39.00)
LDL CALC: 78 mg/dL (ref 0–99)
NONHDL: 92.11
Total CHOL/HDL Ratio: 2
Triglycerides: 72 mg/dL (ref 0.0–149.0)
VLDL: 14.4 mg/dL (ref 0.0–40.0)

## 2017-12-06 LAB — T4, FREE: Free T4: 0.76 ng/dL (ref 0.60–1.60)

## 2017-12-06 LAB — TSH: TSH: 1.61 u[IU]/mL (ref 0.35–4.50)

## 2017-12-06 MED ORDER — ZOSTER VAC RECOMB ADJUVANTED 50 MCG/0.5ML IM SUSR: 0.5000 mL | Freq: Once | INTRAMUSCULAR | 1 refills | Status: AC

## 2017-12-06 NOTE — Patient Instructions (Signed)
Check into cologuard  As a screening for colon  Cancer screening .  Otherwise    Yearly stool tests  Or colonscopy.  Shingles vaccine when available.  Continue lifestyle intervention healthy eating and exercise . If urine sx recur then contact us for recheck urine tests.   Health Maintenance, Female Adopting a healthy lifestyle and getting preventive care can go a long way to promote health and wellness. Talk with your health care provider about what schedule of regular examinations is right for you. This is a good chance for you to check in with your provider about disease prevention and staying healthy. In between checkups, there are plenty of things you can do on your own. Experts have done a lot of research about which lifestyle changes and preventive measures are most likely to keep you healthy. Ask your health care provider for more information. Weight and diet Eat a healthy diet  Be sure to include plenty of vegetables, fruits, low-fat dairy products, and lean protein.  Do not eat a lot of foods high in solid fats, added sugars, or salt.  Get regular exercise. This is one of the most important things you can do for your health. ? Most adults should exercise for at least 150 minutes each week. The exercise should increase your heart rate and make you sweat (moderate-intensity exercise). ? Most adults should also do strengthening exercises at least twice a week. This is in addition to the moderate-intensity exercise.  Maintain a healthy weight  Body mass index (BMI) is a measurement that can be used to identify possible weight problems. It estimates body fat based on height and weight. Your health care provider can help determine your BMI and help you achieve or maintain a healthy weight.  For females 20 years of age and older: ? A BMI below 18.5 is considered underweight. ? A BMI of 18.5 to 24.9 is normal. ? A BMI of 25 to 29.9 is considered overweight. ? A BMI of 30 and above is  considered obese.  Watch levels of cholesterol and blood lipids  You should start having your blood tested for lipids and cholesterol at 58 years of age, then have this test every 5 years.  You may need to have your cholesterol levels checked more often if: ? Your lipid or cholesterol levels are high. ? You are older than 58 years of age. ? You are at high risk for heart disease.  Cancer screening Lung Cancer  Lung cancer screening is recommended for adults 55-80 years old who are at high risk for lung cancer because of a history of smoking.  A yearly low-dose CT scan of the lungs is recommended for people who: ? Currently smoke. ? Have quit within the past 15 years. ? Have at least a 30-pack-year history of smoking. A pack year is smoking an average of one pack of cigarettes a day for 1 year.  Yearly screening should continue until it has been 15 years since you quit.  Yearly screening should stop if you develop a health problem that would prevent you from having lung cancer treatment.  Breast Cancer  Practice breast self-awareness. This means understanding how your breasts normally appear and feel.  It also means doing regular breast self-exams. Let your health care provider know about any changes, no matter how small.  If you are in your 20s or 30s, you should have a clinical breast exam (CBE) by a health care provider every 1-3 years as part of   a regular health exam.  If you are 19 or older, have a CBE every year. Also consider having a breast X-ray (mammogram) every year.  If you have a family history of breast cancer, talk to your health care provider about genetic screening.  If you are at high risk for breast cancer, talk to your health care provider about having an MRI and a mammogram every year.  Breast cancer gene (BRCA) assessment is recommended for women who have family members with BRCA-related cancers. BRCA-related cancers  include: ? Breast. ? Ovarian. ? Tubal. ? Peritoneal cancers.  Results of the assessment will determine the need for genetic counseling and BRCA1 and BRCA2 testing.  Cervical Cancer Your health care provider may recommend that you be screened regularly for cancer of the pelvic organs (ovaries, uterus, and vagina). This screening involves a pelvic examination, including checking for microscopic changes to the surface of your cervix (Pap test). You may be encouraged to have this screening done every 3 years, beginning at age 34.  For women ages 4-65, health care providers may recommend pelvic exams and Pap testing every 3 years, or they may recommend the Pap and pelvic exam, combined with testing for human papilloma virus (HPV), every 5 years. Some types of HPV increase your risk of cervical cancer. Testing for HPV may also be done on women of any age with unclear Pap test results.  Other health care providers may not recommend any screening for nonpregnant women who are considered low risk for pelvic cancer and who do not have symptoms. Ask your health care provider if a screening pelvic exam is right for you.  If you have had past treatment for cervical cancer or a condition that could lead to cancer, you need Pap tests and screening for cancer for at least 20 years after your treatment. If Pap tests have been discontinued, your risk factors (such as having a new sexual partner) need to be reassessed to determine if screening should resume. Some women have medical problems that increase the chance of getting cervical cancer. In these cases, your health care provider may recommend more frequent screening and Pap tests.  Colorectal Cancer  This type of cancer can be detected and often prevented.  Routine colorectal cancer screening usually begins at 58 years of age and continues through 58 years of age.  Your health care provider may recommend screening at an earlier age if you have risk factors  for colon cancer.  Your health care provider may also recommend using home test kits to check for hidden blood in the stool.  A small camera at the end of a tube can be used to examine your colon directly (sigmoidoscopy or colonoscopy). This is done to check for the earliest forms of colorectal cancer.  Routine screening usually begins at age 37.  Direct examination of the colon should be repeated every 5-10 years through 58 years of age. However, you may need to be screened more often if early forms of precancerous polyps or small growths are found.  Skin Cancer  Check your skin from head to toe regularly.  Tell your health care provider about any new moles or changes in moles, especially if there is a change in a mole's shape or color.  Also tell your health care provider if you have a mole that is larger than the size of a pencil eraser.  Always use sunscreen. Apply sunscreen liberally and repeatedly throughout the day.  Protect yourself by wearing long sleeves,  pants, a wide-brimmed hat, and sunglasses whenever you are outside.  Heart disease, diabetes, and high blood pressure  High blood pressure causes heart disease and increases the risk of stroke. High blood pressure is more likely to develop in: ? People who have blood pressure in the high end of the normal range (130-139/85-89 mm Hg). ? People who are overweight or obese. ? People who are African American.  If you are 18-39 years of age, have your blood pressure checked every 3-5 years. If you are 40 years of age or older, have your blood pressure checked every year. You should have your blood pressure measured twice-once when you are at a hospital or clinic, and once when you are not at a hospital or clinic. Record the average of the two measurements. To check your blood pressure when you are not at a hospital or clinic, you can use: ? An automated blood pressure machine at a pharmacy. ? A home blood pressure monitor.  If  you are between 55 years and 79 years old, ask your health care provider if you should take aspirin to prevent strokes.  Have regular diabetes screenings. This involves taking a blood sample to check your fasting blood sugar level. ? If you are at a normal weight and have a low risk for diabetes, have this test once every three years after 58 years of age. ? If you are overweight and have a high risk for diabetes, consider being tested at a younger age or more often. Preventing infection Hepatitis B  If you have a higher risk for hepatitis B, you should be screened for this virus. You are considered at high risk for hepatitis B if: ? You were born in a country where hepatitis B is common. Ask your health care provider which countries are considered high risk. ? Your parents were born in a high-risk country, and you have not been immunized against hepatitis B (hepatitis B vaccine). ? You have HIV or AIDS. ? You use needles to inject street drugs. ? You live with someone who has hepatitis B. ? You have had sex with someone who has hepatitis B. ? You get hemodialysis treatment. ? You take certain medicines for conditions, including cancer, organ transplantation, and autoimmune conditions.  Hepatitis C  Blood testing is recommended for: ? Everyone born from 1945 through 1965. ? Anyone with known risk factors for hepatitis C.  Sexually transmitted infections (STIs)  You should be screened for sexually transmitted infections (STIs) including gonorrhea and chlamydia if: ? You are sexually active and are younger than 58 years of age. ? You are older than 58 years of age and your health care provider tells you that you are at risk for this type of infection. ? Your sexual activity has changed since you were last screened and you are at an increased risk for chlamydia or gonorrhea. Ask your health care provider if you are at risk.  If you do not have HIV, but are at risk, it may be recommended  that you take a prescription medicine daily to prevent HIV infection. This is called pre-exposure prophylaxis (PrEP). You are considered at risk if: ? You are sexually active and do not regularly use condoms or know the HIV status of your partner(s). ? You take drugs by injection. ? You are sexually active with a partner who has HIV.  Talk with your health care provider about whether you are at high risk of being infected with HIV. If you   choose to begin PrEP, you should first be tested for HIV. You should then be tested every 3 months for as long as you are taking PrEP. Pregnancy  If you are premenopausal and you may become pregnant, ask your health care provider about preconception counseling.  If you may become pregnant, take 400 to 800 micrograms (mcg) of folic acid every day.  If you want to prevent pregnancy, talk to your health care provider about birth control (contraception). Osteoporosis and menopause  Osteoporosis is a disease in which the bones lose minerals and strength with aging. This can result in serious bone fractures. Your risk for osteoporosis can be identified using a bone density scan.  If you are 65 years of age or older, or if you are at risk for osteoporosis and fractures, ask your health care provider if you should be screened.  Ask your health care provider whether you should take a calcium or vitamin D supplement to lower your risk for osteoporosis.  Menopause may have certain physical symptoms and risks.  Hormone replacement therapy may reduce some of these symptoms and risks. Talk to your health care provider about whether hormone replacement therapy is right for you. Follow these instructions at home:  Schedule regular health, dental, and eye exams.  Stay current with your immunizations.  Do not use any tobacco products including cigarettes, chewing tobacco, or electronic cigarettes.  If you are pregnant, do not drink alcohol.  If you are  breastfeeding, limit how much and how often you drink alcohol.  Limit alcohol intake to no more than 1 drink per day for nonpregnant women. One drink equals 12 ounces of beer, 5 ounces of wine, or 1 ounces of hard liquor.  Do not use street drugs.  Do not share needles.  Ask your health care provider for help if you need support or information about quitting drugs.  Tell your health care provider if you often feel depressed.  Tell your health care provider if you have ever been abused or do not feel safe at home. This information is not intended to replace advice given to you by your health care provider. Make sure you discuss any questions you have with your health care provider. Document Released: 02/23/2011 Document Revised: 01/16/2016 Document Reviewed: 05/14/2015 Elsevier Interactive Patient Education  2018 Elsevier Inc.   

## 2017-12-07 LAB — HEPATITIS C ANTIBODY
HEP C AB: NONREACTIVE
SIGNAL TO CUT-OFF: 0.01 (ref <1.00)

## 2017-12-09 ENCOUNTER — Telehealth: Payer: Self-pay | Admitting: Internal Medicine

## 2017-12-09 NOTE — Telephone Encounter (Signed)
Lab results given Verbalizes understanding 

## 2017-12-09 NOTE — Telephone Encounter (Signed)
Copied from CRM [id number]. Topic: Quick Communication - Lab Results >> Dec 09, 2017 11:24 AM Carola RhineKigotho, Nancy N, CMA wrote: Called patient to inform them of  lab results. When patient returns call, triage nurse may disclose results.   Pt returned call for lab results

## 2018-05-31 ENCOUNTER — Encounter: Payer: Self-pay | Admitting: Family Medicine

## 2018-05-31 ENCOUNTER — Ambulatory Visit (INDEPENDENT_AMBULATORY_CARE_PROVIDER_SITE_OTHER): Payer: BLUE CROSS/BLUE SHIELD | Admitting: Family Medicine

## 2018-05-31 VITALS — BP 110/80 | HR 85 | Temp 98.2°F | Ht 65.5 in | Wt 130.5 lb

## 2018-05-31 DIAGNOSIS — R300 Dysuria: Secondary | ICD-10-CM

## 2018-05-31 LAB — POCT URINALYSIS DIPSTICK
BILIRUBIN UA: NEGATIVE
Blood, UA: NEGATIVE
Glucose, UA: NEGATIVE
Ketones, UA: NEGATIVE
LEUKOCYTES UA: NEGATIVE
NITRITE UA: NEGATIVE
Protein, UA: NEGATIVE
SPEC GRAV UA: 1.02 (ref 1.010–1.025)
UROBILINOGEN UA: 0.2 U/dL
pH, UA: 5 (ref 5.0–8.0)

## 2018-05-31 NOTE — Progress Notes (Signed)
  HPI:  Using dictation device. Unfortunately this device frequently misinterprets words/phrases.  Acute visit for Dysuria: -started 4 days ago -symptoms: frequency, mild dysuria -denies: fevers, malaise, NV, flank pain, hematuria, vaginal symptoms -now feeling better today, drinks lots of water -hx UTI  ROS: See pertinent positives and negatives per HPI.  Past Medical History:  Diagnosis Date  . Endometriosis   . Hyperthyroidism     Past Surgical History:  Procedure Laterality Date  . LAPAROSCOPY     x2    Family History  Problem Relation Age of Onset  . Stroke Mother   . Hypothyroidism Mother   . Hypertension Mother   . Hypertension Father   . Depression Brother   . Colon cancer Brother        age 32     SOCIAL HX: see hpi   Current Outpatient Medications:  .  loratadine (CLARITIN) 10 MG tablet, Take 10 mg by mouth daily., Disp: , Rfl:   EXAM:  Vitals:   05/31/18 1403  BP: 110/80  Pulse: 85  Temp: 98.2 F (36.8 C)    Body mass index is 21.39 kg/m.  GENERAL: vitals reviewed and listed above, alert, oriented, appears well hydrated and in no acute distress  HEENT: atraumatic, conjunttiva clear, no obvious abnormalities on inspection of external nose and ears  NECK: no obvious masses on inspection  LUNGS: clear to auscultation bilaterally, no wheezes, rales or rhonchi, good air movement  CV: HRRR, no peripheral edema  ABD: BS+, soft, NTTP, no CVA TTP  MS: moves all extremities without noticeable abnormality  PSYCH: pleasant and cooperative, no obvious depression or anxiety  ASSESSMENT AND PLAN:  Discussed the following assessment and plan:  Dysuria - Plan: POC Urinalysis Dipstick, Culture, Urine  -udip good, culture pending - treat accordingly -doing better -Patient advised to follow up promptly if symptoms return  or new concerns arise. -declined AVS Reviewed HM due - reports she is aware and working with PCP  There are no Patient  Instructions on file for this visit.  Terressa Koyanagi, DO

## 2018-06-02 LAB — URINE CULTURE
MICRO NUMBER:: 91209049
SPECIMEN QUALITY:: ADEQUATE

## 2018-06-02 MED ORDER — NITROFURANTOIN MONOHYD MACRO 100 MG PO CAPS: 100.0000 mg | ORAL_CAPSULE | Freq: Two times a day (BID) | ORAL | 0 refills | Status: DC

## 2018-06-02 NOTE — Addendum Note (Signed)
Addended by: Waymon Amato R on: 06/02/2018 05:44 PM   Modules accepted: Orders

## 2018-06-03 ENCOUNTER — Encounter: Payer: Self-pay | Admitting: *Deleted

## 2018-06-03 ENCOUNTER — Other Ambulatory Visit: Payer: Self-pay | Admitting: *Deleted

## 2018-06-03 NOTE — Telephone Encounter (Signed)
Spoke with pt and gave results and rx info. She voiced understanding. Nothing further needed.

## 2018-10-10 NOTE — Progress Notes (Signed)
Chief Complaint  Patient presents with  . Diarrhea    pt states she has been having diarrhea for over a week. Pt states she has been losing weight without trying about 10 pounds. Pt has been stressed due to husband having cancer and father in law passing away pt believes that her graves disease could of been triggered again due to the stress     HPI: Alison Holloway 58 y.o. come in for   New problem last seen   By me 4 19 cpx PV   See above      Onset   7-8  Per 24 hours  Watery to loose. Stools without blood fever   But reved up and feels like when she had acrive graves  rx with  RAi in past   Other factors  Last 5-6 months   Father in law died and caretaking  And then husband had comlication of  Lung cancer dx.  And jsut had a lobectomy  ? if could have graves recurrance  Had  rai at the time.  Over 10 years ago  Stomach issues for a while   Taking ehalhty diet fiber but  Avoiding lactose Gi sx  getting worse. The last week .  No med taken for the diarrhea  Cant explain   Weight loss.  ROS: See pertinent positives and negatives per HPI. No cp sob tru tremor   Lactose intolerant  But  Eating  Neg tad  Min to no caffiene  Last antibiotic  Months ago for uti no hx of c diff and no pain vomiting skin change utisx  Past Medical History:  Diagnosis Date  . Endometriosis   . Hyperthyroidism     Family History  Problem Relation Age of Onset  . Stroke Mother   . Hypothyroidism Mother   . Hypertension Mother   . Hypertension Father   . Depression Brother   . Colon cancer Brother        age 16     Social History   Socioeconomic History  . Marital status: Married    Spouse name: Not on file  . Number of children: Not on file  . Years of education: Not on file  . Highest education level: Not on file  Occupational History  . Not on file  Social Needs  . Financial resource strain: Not on file  . Food insecurity:    Worry: Not on file    Inability: Not on file  .  Transportation needs:    Medical: Not on file    Non-medical: Not on file  Tobacco Use  . Smoking status: Former Games developer  . Smokeless tobacco: Never Used  Substance and Sexual Activity  . Alcohol use: Yes    Alcohol/week: 0.0 standard drinks  . Drug use: No  . Sexual activity: Yes    Partners: Male  Lifestyle  . Physical activity:    Days per week: Not on file    Minutes per session: Not on file  . Stress: Not on file  Relationships  . Social connections:    Talks on phone: Not on file    Gets together: Not on file    Attends religious service: Not on file    Active member of club or organization: Not on file    Attends meetings of clubs or organizations: Not on file    Relationship status: Not on file  Other Topics Concern  . Not on file  Social History  Narrative   7 hours of sleep per night   Lives with her husband and son  h h of 3     Currently not working   1 dog in the home.   g2 p2    Soc: etoh  No tobacco .     Outpatient Medications Prior to Visit  Medication Sig Dispense Refill  . loratadine (CLARITIN) 10 MG tablet Take 10 mg by mouth daily.    . nitrofurantoin, macrocrystal-monohydrate, (MACROBID) 100 MG capsule Take 1 capsule (100 mg total) by mouth 2 (two) times daily. 10 capsule 0   No facility-administered medications prior to visit.      EXAM:  BP 120/78 (BP Location: Right Arm, Patient Position: Sitting, Cuff Size: Normal)   Pulse 64   Temp 98.6 F (37 C) (Oral)   Wt 120 lb 9.6 oz (54.7 kg)   LMP 12/23/2011   BMI 19.76 kg/m   Body mass index is 19.76 kg/m.  GENERAL: vitals reviewed and listed above, alert, oriented, appears well hydrated and in no acute distress looks tired and under weight  HEENT: atraumatic, conjunctiva  clear, no obvious abnormalities on inspection of external nose and ears OP : no lesion edema or exudate  NECK: no obvious masses on inspectionright thyroid palpable nontender   LUNGS: clear to auscultation bilaterally,  no wheezes, rales or rhonchi, good air movement CV: HRRR, no clubbing cyanosis or  peripheral edema nl cap refill  Abdomen:  Sof,t normal bowel sounds without hepatosplenomegaly, no guarding rebound or masses no CVA tenderness MS: moves all extremities without noticeable focal  abnormality PSYCH: pleasant and cooperative, neuro non focal  No sig tremor  Lab Results  Component Value Date   WBC 8.3 12/06/2017   HGB 12.9 12/06/2017   HCT 38.2 12/06/2017   PLT 206.0 12/06/2017   GLUCOSE 84 12/06/2017   CHOL 157 12/06/2017   TRIG 72.0 12/06/2017   HDL 64.40 12/06/2017   LDLCALC 78 12/06/2017   ALT 18 12/06/2017   AST 17 12/06/2017   NA 142 12/06/2017   K 4.1 12/06/2017   CL 105 12/06/2017   CREATININE 0.58 12/06/2017   BUN 11 12/06/2017   CO2 29 12/06/2017   TSH 1.61 12/06/2017   BP Readings from Last 3 Encounters:  10/11/18 120/78  05/31/18 110/80  12/06/17 102/70    ASSESSMENT AND PLAN:  Discussed the following assessment and plan:  Diarrhea, unspecified type - Plan: Basic metabolic panel, CBC with Differential/Platelet, Hepatic function panel, TSH, T4, free, T3, free, Celiac Disease Comprehensive Panel with Reflexes, Sedimentation rate, C-reactive protein  Weight loss - Plan: Basic metabolic panel, CBC with Differential/Platelet, Hepatic function panel, TSH, T4, free, T3, free, Celiac Disease Comprehensive Panel with Reflexes, Sedimentation rate, C-reactive protein  History of Graves' disease - Plan: Basic metabolic panel, CBC with Differential/Platelet, Hepatic function panel, TSH, T4, free, T3, free, Celiac Disease Comprehensive Panel with Reflexes, Sedimentation rate, C-reactive protein  Stress due to illness of family member Screening labs  And if  Nor  Helpful will consider stool test and gi inpout she hasnt had colon canc er screening for practical reasons but has had no fam hx of   GI cancer  -Patient advised to return or notify health care team  if  new concerns  arise.  Patient Instructions  Checking labs as planned  If not obvious cause we may get stool samples and  Get GI involved .           Burna Mortimer  KRegis Bill M.D.

## 2018-10-11 ENCOUNTER — Ambulatory Visit (INDEPENDENT_AMBULATORY_CARE_PROVIDER_SITE_OTHER): Payer: BLUE CROSS/BLUE SHIELD | Admitting: Internal Medicine

## 2018-10-11 ENCOUNTER — Encounter: Payer: Self-pay | Admitting: Internal Medicine

## 2018-10-11 VITALS — BP 120/78 | HR 64 | Temp 98.6°F | Wt 120.6 lb

## 2018-10-11 DIAGNOSIS — Z8639 Personal history of other endocrine, nutritional and metabolic disease: Secondary | ICD-10-CM

## 2018-10-11 DIAGNOSIS — R634 Abnormal weight loss: Secondary | ICD-10-CM | POA: Diagnosis not present

## 2018-10-11 DIAGNOSIS — Z6379 Other stressful life events affecting family and household: Secondary | ICD-10-CM | POA: Diagnosis not present

## 2018-10-11 DIAGNOSIS — R197 Diarrhea, unspecified: Secondary | ICD-10-CM | POA: Diagnosis not present

## 2018-10-11 LAB — HEPATIC FUNCTION PANEL
ALBUMIN: 3.9 g/dL (ref 3.5–5.2)
ALT: 23 U/L (ref 0–35)
AST: 22 U/L (ref 0–37)
Alkaline Phosphatase: 82 U/L (ref 39–117)
BILIRUBIN TOTAL: 0.5 mg/dL (ref 0.2–1.2)
Bilirubin, Direct: 0.1 mg/dL (ref 0.0–0.3)
Total Protein: 6.4 g/dL (ref 6.0–8.3)

## 2018-10-11 LAB — CBC WITH DIFFERENTIAL/PLATELET
BASOS ABS: 0 10*3/uL (ref 0.0–0.1)
Basophils Relative: 0.5 % (ref 0.0–3.0)
Eosinophils Absolute: 0 10*3/uL (ref 0.0–0.7)
Eosinophils Relative: 1.2 % (ref 0.0–5.0)
HEMATOCRIT: 39 % (ref 36.0–46.0)
Hemoglobin: 13 g/dL (ref 12.0–15.0)
LYMPHS PCT: 30.8 % (ref 12.0–46.0)
Lymphs Abs: 1.3 10*3/uL (ref 0.7–4.0)
MCHC: 33.3 g/dL (ref 30.0–36.0)
MCV: 95.1 fl (ref 78.0–100.0)
MONO ABS: 0.7 10*3/uL (ref 0.1–1.0)
Monocytes Relative: 17.3 % — ABNORMAL HIGH (ref 3.0–12.0)
NEUTROS ABS: 2 10*3/uL (ref 1.4–7.7)
NEUTROS PCT: 50.2 % (ref 43.0–77.0)
PLATELETS: 217 10*3/uL (ref 150.0–400.0)
RBC: 4.1 Mil/uL (ref 3.87–5.11)
RDW: 12.5 % (ref 11.5–15.5)
WBC: 4.1 10*3/uL (ref 4.0–10.5)

## 2018-10-11 LAB — BASIC METABOLIC PANEL
BUN: 9 mg/dL (ref 6–23)
CHLORIDE: 105 meq/L (ref 96–112)
CO2: 31 meq/L (ref 19–32)
Calcium: 9.3 mg/dL (ref 8.4–10.5)
Creatinine, Ser: 0.62 mg/dL (ref 0.40–1.20)
GFR: 98.81 mL/min (ref >60.00)
Glucose, Bld: 77 mg/dL (ref 70–99)
Potassium: 4.1 mEq/L (ref 3.5–5.1)
SODIUM: 143 meq/L (ref 135–145)

## 2018-10-11 LAB — T3, FREE: T3, Free: 3.3 pg/mL (ref 2.3–4.2)

## 2018-10-11 LAB — SEDIMENTATION RATE: Sed Rate: 7 mm/hr (ref 0–30)

## 2018-10-11 LAB — C-REACTIVE PROTEIN: CRP: <1 mg/dL (ref 0.5–20.0)

## 2018-10-11 LAB — T4, FREE: FREE T4: 0.83 ng/dL (ref 0.60–1.60)

## 2018-10-11 LAB — TSH: TSH: 1.48 u[IU]/mL (ref 0.35–4.50)

## 2018-10-11 NOTE — Patient Instructions (Signed)
Checking labs as planned  If not obvious cause we may get stool samples and  Get GI involved .

## 2018-10-12 LAB — CELIAC DISEASE COMPREHENSIVE PANEL WITH REFLEXES
(tTG) Ab, IgA: 1 U/mL
Immunoglobulin A: 239 mg/dL (ref 47–310)

## 2018-10-13 ENCOUNTER — Other Ambulatory Visit: Payer: Self-pay

## 2018-10-13 DIAGNOSIS — R197 Diarrhea, unspecified: Secondary | ICD-10-CM

## 2018-10-17 ENCOUNTER — Other Ambulatory Visit: Payer: Self-pay

## 2018-10-17 DIAGNOSIS — R197 Diarrhea, unspecified: Secondary | ICD-10-CM

## 2018-10-17 NOTE — Addendum Note (Signed)
Addended by: Conrad Cleone on: 10/17/2018 09:31 AM   Modules accepted: Orders

## 2018-10-18 ENCOUNTER — Telehealth: Payer: Self-pay | Admitting: Internal Medicine

## 2018-10-18 DIAGNOSIS — Z8639 Personal history of other endocrine, nutritional and metabolic disease: Secondary | ICD-10-CM

## 2018-10-18 DIAGNOSIS — R634 Abnormal weight loss: Secondary | ICD-10-CM

## 2018-10-18 DIAGNOSIS — R197 Diarrhea, unspecified: Secondary | ICD-10-CM

## 2018-10-18 NOTE — Telephone Encounter (Signed)
Copied from CRM [id number]. Topic: Referral - Request for Referral >> Oct 18, 2018 10:03 AM Maia Petties wrote: Has patient seen PCP for this complaint? Yes  *If NO, is insurance requiring patient see PCP for this issue before PCP can refer them?  Referral for which specialty: Endocrinology Preferred provider/office: Dr. Elvera Lennox - new referral needed since pt not seen since 2015 Reason for referral: knot on [place] area - HX of Grave's disease - she would like another opinion even though Dr. Fabian Sharp reviewed labs and noted they are ok.

## 2018-10-18 NOTE — Telephone Encounter (Signed)
Left detailed message for pt that referral has been dropped

## 2018-10-18 NOTE — Telephone Encounter (Signed)
Sure  Please do a referral to endodrinology dr Reece Agar   For  Hx graves and   Weight loss and diarrhea  As she requetesed

## 2018-10-20 ENCOUNTER — Encounter: Payer: Self-pay | Admitting: Physician Assistant

## 2018-10-22 LAB — TIQ-NTM

## 2018-10-22 LAB — STOOL CULTURE
MICRO NUMBER: 241804
MICRO NUMBER:: 241801
MICRO NUMBER:: 241802
SHIGA RESULT:: NOT DETECTED
SPECIMEN QUALITY:: ADEQUATE
SPECIMEN QUALITY:: ADEQUATE
SPECIMEN QUALITY:: ADEQUATE

## 2018-10-22 LAB — GIARDIA/CRYPTOSPORIDIUM (EIA)
MICRO NUMBER:: 241803
RESULT:: NOT DETECTED
SPECIMEN QUALITY:: ADEQUATE

## 2018-10-22 LAB — CLOSTRIDIUM DIFFICILE TOXIN B, QUALITATIVE, REAL-TIME PCR: Toxigenic C. Difficile by PCR: NOT DETECTED

## 2018-11-01 ENCOUNTER — Ambulatory Visit (INDEPENDENT_AMBULATORY_CARE_PROVIDER_SITE_OTHER): Payer: BLUE CROSS/BLUE SHIELD | Admitting: Internal Medicine

## 2018-11-01 ENCOUNTER — Encounter: Payer: Self-pay | Admitting: Internal Medicine

## 2018-11-01 VITALS — BP 110/70 | HR 78 | Temp 98.5°F | Wt 121.1 lb

## 2018-11-01 DIAGNOSIS — F419 Anxiety disorder, unspecified: Secondary | ICD-10-CM | POA: Diagnosis not present

## 2018-11-01 DIAGNOSIS — E041 Nontoxic single thyroid nodule: Secondary | ICD-10-CM | POA: Insufficient documentation

## 2018-11-01 DIAGNOSIS — E042 Nontoxic multinodular goiter: Secondary | ICD-10-CM | POA: Diagnosis not present

## 2018-11-01 NOTE — Patient Instructions (Addendum)
Good seeing you this afternoon.  Instructions: -Will refer to endocrine again for your new left sided thyroid nodule.

## 2018-11-01 NOTE — Progress Notes (Signed)
Established Patient Office Visit     CC/Reason for Visit: lump on neck  HPI: Alison Holloway is a 58 y.o. female who is coming in today for the above mentioned reasons. Past Medical History is significant for Graves s/p RAI ablation and multinodular goiter s/p FNA of 2 right sided nodules in 2015 (benign follicular nodules).  Patient c/o of a lump on her neck that she noticed a few weeks ago on the mid left side of her neck.  Patient c/o palpitations, anxiety, diarrhea and weight loss but recent TFTs were normal.  Patient c/o of a stressful two months. FIL died and husband diagnosed with lung cancer. Patient sts shes had diarrhea for the last 6 weeks and its just now clearing up and she is very fatigued.  Denies pain or fever.  Patient sts at last visit she had a referral for GI and has an appointment tomorrow.    Past Medical/Surgical History: Past Medical History:  Diagnosis Date  . Endometriosis   . Hyperthyroidism     Past Surgical History:  Procedure Laterality Date  . LAPAROSCOPY     x2    Social History:  reports that she has quit smoking. She has never used smokeless tobacco. She reports current alcohol use. She reports that she does not use drugs.  Allergies: Allergies  Allergen Reactions  . Cephalexin     REACTION: rash    Family History:  Family History  Problem Relation Age of Onset  . Stroke Mother   . Hypothyroidism Mother   . Hypertension Mother   . Hypertension Father   . Depression Brother   . Colon cancer Brother        age 55      Current Outpatient Medications:  .  loratadine (CLARITIN) 10 MG tablet, Take 10 mg by mouth daily., Disp: , Rfl:   Review of Systems: Constitutional: Denies fever, chills, diaphoresis, appetite change.  C/o fatigue HEENT: Denies photophobia, eye pain, redness, hearing loss, ear pain, congestion, sore throat, rhinorrhea, sneezing, mouth sores, trouble swallowing, neck pain, neck stiffness and tinnitus.     Respiratory: Denies SOB, DOE, cough, chest tightness,  and wheezing.   Cardiovascular: Denies chest pain,  and leg swelling. C/o palpitations Gastrointestinal: Denies nausea, vomiting, abdominal pain, diarrhea, constipation, blood in stool and abdominal distention.  Genitourinary: Denies dysuria, urgency, frequency, hematuria, flank pain and difficulty urinating.  Endocrine: Denies: hot or cold intolerance, sweats, changes in hair or nails, polyuria, polydipsia. Musculoskeletal: Denies myalgias, back pain, joint swelling, arthralgias and gait problem.  Skin: Denies pallor, rash and wound.  Neurological: Denies dizziness, seizures, syncope, weakness, light-headedness, numbness and headaches.  Hematological: Denies adenopathy. Easy bruising, personal or family bleeding history  Psychiatric/Behavioral: Denies suicidal ideation, mood changes, confusion, nervousness, sleep disturbance and agitation   Physical Exam: Vitals:   11/01/18 1632  BP: 110/70  Pulse: 78  Temp: 98.5 F (36.9 C)  TempSrc: Oral  SpO2: 98%  Weight: 121 lb 1.6 oz (54.9 kg)    Body mass index is 19.85 kg/m.   Constitutional: NAD, comfortable Eyes: PERRL, lids and conjunctivae normal Neck:   nodule on left side of thyroid about mid way up, hard and fixed, size of a dime Respiratory: clear to auscultation bilaterally, no wheezing, no crackles. Normal respiratory effort. No accessory muscle use.  Cardiovascular: Regular rate and rhythm, no murmurs / rubs / gallops. No extremity edema. 2+ pedal pulses.  Psychiatric: Normal judgment and insight. Alert and oriented x  3. Moderate anxiety.    Impression and Plan:  Thyroid nodule -resent referral to endo, will likely need a thyroid US and FNA if a new thyroid nodule is confirmed.  Multinodular goiter/?Graves Dz -had radioactive iodine years ago    Patient Instructions  Good seeing you this afternoon.  Instructions: -Will refer to endocrine again for your new  left sided thyroid nodule.       Murlean Iba, RN DNP Student  Primary [facility] at [facility]

## 2018-11-02 ENCOUNTER — Encounter: Payer: Self-pay | Admitting: Physician Assistant

## 2018-11-02 ENCOUNTER — Other Ambulatory Visit: Payer: Self-pay

## 2018-11-02 ENCOUNTER — Ambulatory Visit (INDEPENDENT_AMBULATORY_CARE_PROVIDER_SITE_OTHER): Payer: BLUE CROSS/BLUE SHIELD | Admitting: Physician Assistant

## 2018-11-02 VITALS — BP 106/68 | HR 72 | Temp 98.6°F | Ht 65.5 in | Wt 119.4 lb

## 2018-11-02 DIAGNOSIS — Z1211 Encounter for screening for malignant neoplasm of colon: Secondary | ICD-10-CM

## 2018-11-02 DIAGNOSIS — R197 Diarrhea, unspecified: Secondary | ICD-10-CM

## 2018-11-02 NOTE — Progress Notes (Signed)
Chief Complaint: Diarrhea  HPI:    Alison Holloway is a 58 year old Caucasian female with a past medical history as listed below, who was referred to me by Panosh, Neta MendsWanda K, MD for a complaint of diarrhea.      Today, the patient describes that at the end of January she started with loose runny stools which increased in frequency over the next 6 weeks.  She completely changed her diet with no fiber at all and started eating very bland foods which seemed to make no difference.  Over the past week she added back in bread and her symptoms have cleared by themselves.  She is now back to soft solid stools 2 times a day.  Initially patient thought this was related to her thyroid but levels were checked and this was normal.  Also had stool studies which were normal.      Describes occasional bright red blood per rectum which is been ongoing for a while now.  Tells me that she believes she has a fissure which "comes and goes" and has never fully healed.  Currently not giving her any problems.    Associated symptoms include a weight loss of about 10 pounds over the past 5 months without trying.    Patient tells me she typically has a very healthy diet which is high in fiber and is eager to get back to her normal eating habits.  Family history is questionable for possible diagnosis of Crohn's in her mother.  Patient does tell me that she recently had a lot of stress in her life with her husband being diagnosed with lung cancer and having a lung perforation, ending up in the hospital for 3 weeks as well as losing her father-in-law.  Some of this has now settled down.  But symptoms did start around the same time as this extra stress.    Denies fever, chills, anorexia, nausea, vomiting or abdominal pain.  Past Medical History:  Diagnosis Date  . Anal fissure   . Endometriosis   . Hyperthyroidism     Past Surgical History:  Procedure Laterality Date  . LAPAROSCOPY     x2    Current Outpatient Medications   Medication Sig Dispense Refill  . loratadine (CLARITIN) 10 MG tablet Take 10 mg by mouth daily.     No current facility-administered medications for this visit.     Allergies as of 11/02/2018 - Review Complete 11/02/2018  Allergen Reaction Noted  . Cephalexin      Family History  Problem Relation Age of Onset  . Stroke Mother   . Hypothyroidism Mother   . Hypertension Mother   . Hypertension Father   . Depression Brother   . Colon cancer Brother        age 59     Social History   Socioeconomic History  . Marital status: Married    Spouse name: Not on file  . Number of children: 2  . Years of education: Not on file  . Highest education level: Not on file  Occupational History  . Not on file  Social Needs  . Financial resource strain: Not on file  . Food insecurity:    Worry: Not on file    Inability: Not on file  . Transportation needs:    Medical: Not on file    Non-medical: Not on file  Tobacco Use  . Smoking status: Former Games developermoker  . Smokeless tobacco: Never Used  Substance and Sexual Activity  . Alcohol  use: Yes    Alcohol/week: 0.0 standard drinks  . Drug use: No  . Sexual activity: Yes    Partners: Male  Lifestyle  . Physical activity:    Days per week: Not on file    Minutes per session: Not on file  . Stress: Not on file  Relationships  . Social connections:    Talks on phone: Not on file    Gets together: Not on file    Attends religious service: Not on file    Active member of club or organization: Not on file    Attends meetings of clubs or organizations: Not on file    Relationship status: Not on file  . Intimate partner violence:    Fear of current or ex partner: Not on file    Emotionally abused: Not on file    Physically abused: Not on file    Forced sexual activity: Not on file  Other Topics Concern  . Not on file  Social History Narrative   7 hours of sleep per night   Lives with her husband and son  h h of 3     Currently not  working   1 dog in the home.   g2 p2    Soc: etoh  No tobacco .     Review of Systems:    Constitutional: No weight loss, fever or chills Skin: No rash  Cardiovascular: No chest pain Respiratory: No SOB Gastrointestinal: See HPI and otherwise negative Genitourinary: No dysuria Neurological: No headache, dizziness or syncope Musculoskeletal: No new muscle or joint pain Hematologic: No bleeding  Psychiatric: No history of depression or anxiety   Physical Exam:  Vital signs: BP 106/68   Pulse 72   Temp 98.6 F (37 C) (Oral)   Ht 5' 5.5" (1.664 m)   Wt 119 lb 6.4 oz (54.2 kg)   LMP 12/23/2011   BMI 19.57 kg/m   Constitutional:   Pleasant Caucasian female appears to be in NAD, Well developed, Well nourished, alert and cooperative Head:  Normocephalic and atraumatic. Eyes:   PEERL, EOMI. No icterus. Conjunctiva pink. Ears:  Normal auditory acuity. Neck:  Supple Throat: Oral cavity and pharynx without inflammation, swelling or lesion.  Respiratory: Respirations even and unlabored. Lungs clear to auscultation bilaterally.   No wheezes, crackles, or rhonchi.  Cardiovascular: Normal S1, S2. No MRG. Regular rate and rhythm. No peripheral edema, cyanosis or pallor.  Gastrointestinal:  Soft, nondistended, nontender. No rebound or guarding. Normal bowel sounds. No appreciable masses or hepatomegaly. Rectal:  Not performed.  Msk:  Symmetrical without gross deformities. Without edema, no deformity or joint abnormality.  Neurologic:  Alert and  oriented x4;  grossly normal neurologically.  Skin:   Dry and intact without significant lesions or rashes. Psychiatric: Demonstrates good judgement and reason without abnormal affect or behaviors.  RELEVANT LABS AND IMAGING: CBC    Component Value Date/Time   WBC 4.1 10/11/2018 1106   RBC 4.10 10/11/2018 1106   HGB 13.0 10/11/2018 1106   HCT 39.0 10/11/2018 1106   PLT 217.0 10/11/2018 1106   MCV 95.1 10/11/2018 1106   MCHC 33.3  10/11/2018 1106   RDW 12.5 10/11/2018 1106   LYMPHSABS 1.3 10/11/2018 1106   MONOABS 0.7 10/11/2018 1106   EOSABS 0.0 10/11/2018 1106   BASOSABS 0.0 10/11/2018 1106    CMP     Component Value Date/Time   NA 143 10/11/2018 1106   K 4.1 10/11/2018 1106   CL 105 10/11/2018  1106   CO2 31 10/11/2018 1106   GLUCOSE 77 10/11/2018 1106   BUN 9 10/11/2018 1106   CREATININE 0.62 10/11/2018 1106   CALCIUM 9.3 10/11/2018 1106   PROT 6.4 10/11/2018 1106   ALBUMIN 3.9 10/11/2018 1106   AST 22 10/11/2018 1106   ALT 23 10/11/2018 1106   ALKPHOS 82 10/11/2018 1106   BILITOT 0.5 10/11/2018 1106   GFRNONAA 108.53 02/05/2010 1019   GFRAA 116 06/28/2007 1056    Assessment: 1.  Diarrhea: x 6 weeks, back to normal over the past week, stool studies negative/normal, thyroid testing normal; consider IBS from increased stress 2.  Screening for colorectal cancer: Patient has never had a colonoscopy, she will call back to schedule screening 3.  Anal fissure: Did not want me to examine today as she current is currently not having any problems, but does want this evaluated at time of colonoscopy  Plan: 1.  Patient declined rectal exam today.  She would prefer to have this checked out when she has her colonoscopy for possible fissure.  Did discuss nitroglycerin versus diltiazem for treatment if this is the case.  This can be evaluated during colonoscopy. 2.  Patient tells me she does not know her schedule at the moment but will call back in the next week when she finds out her husband's chemotherapy schedule in order to schedule her screening colonoscopy.  This will be scheduled in the [facility] with Dr. Lavon Paganini.  Did discuss risks, benefits, limitations and alternatives and the patient agrees to proceed.  She will need to come back in for a pre-visit prior to appointment to discuss prep. 3.  Patient to follow in clinic per recommendations from Dr. Lavon Paganini after time of procedure.  Alison Meeker, PA-C [facility] 11/02/2018, 3:11 PM  Cc: Madelin Headings, MD

## 2018-11-02 NOTE — Patient Instructions (Signed)
Normal BMI (Body Mass Index- based on height and weight) is between 19 and 25. Your BMI today is Body mass index is 19.57 kg/m. Alison Holloway Please consider follow up  regarding your BMI with your Primary Care Provider.  Please call us back in a week to schedule colonoscopy

## 2018-11-03 ENCOUNTER — Ambulatory Visit: Payer: BLUE CROSS/BLUE SHIELD | Admitting: Internal Medicine

## 2018-11-09 ENCOUNTER — Other Ambulatory Visit: Payer: Self-pay

## 2018-11-09 ENCOUNTER — Encounter: Payer: Self-pay | Admitting: Internal Medicine

## 2018-11-09 ENCOUNTER — Ambulatory Visit (INDEPENDENT_AMBULATORY_CARE_PROVIDER_SITE_OTHER): Payer: BLUE CROSS/BLUE SHIELD | Admitting: Internal Medicine

## 2018-11-09 VITALS — BP 110/68 | HR 86 | Temp 98.1°F | Ht 65.5 in | Wt 121.0 lb

## 2018-11-09 DIAGNOSIS — R5383 Other fatigue: Secondary | ICD-10-CM | POA: Diagnosis not present

## 2018-11-09 DIAGNOSIS — E042 Nontoxic multinodular goiter: Secondary | ICD-10-CM

## 2018-11-09 LAB — CORTISOL: Cortisol, Plasma: 10 ug/dL

## 2018-11-09 NOTE — Patient Instructions (Signed)
Please stop at the lab.  Will schedule a new thyroid U/S.  Please come back for a follow-up appointment in 1 year.

## 2018-11-09 NOTE — Progress Notes (Signed)
Patient ID: Alison Holloway, female   DOB: [birth year], 58 y.o.   MRN: [id number]   HPI  Alison Holloway is a 58 y.o.-year-old female, self-referred, for management of h/o Graves ds., h/o post-ablative hypothyroidism, thyroid nodules. Last visit 4.5 years ago.  She describes that starting in early 09/2018 she developed: diarrhea, palpitations, increased insomnia, felt "in overdrive", also noticed a new "knot" in neck. She is also very tired, continuing losing weight.  She suspected thyrotoxicosis and saw PCP a month ago.  TFTs were normal.  Reviewing her chart, she lost 70 lbs since 2011.  She felt that she gained a lot of weight after quitting smoking at the end of the 1990s.  She tells me she initially lost the weight intentionally, but not the last 10-12 lbs. She was on a diet before she developed diarrhea 1.5 months ago, but afterwards, she came off the diet  She also has a lot of stress going on: husband with lung cancer.  Reviewed and addended hx:  Pt. has been dx with Graves ds ~1996, s/p RAI tx in ~2000 (no records available), then developed hypothyroidism afterwards >> was on Levothyroxine then stopped ~2011 as she started to feel hyperthyroid again >> resolved after stopping levothyroxine.  TFTs were normal at each check: Lab Results  Component Value Date   TSH 1.48 10/11/2018   TSH 1.61 12/06/2017   TSH 1.07 06/14/2015   TSH 1.870 05/22/2014   TSH 1.95 05/30/2012   TSH 1.44 08/27/2010   TSH 1.45 02/05/2010   TSH 1.08 06/18/2008   TSH 1.65 06/28/2007   TSH 1.61 06/25/2006   FREET4 0.83 10/11/2018   FREET4 0.76 12/06/2017   FREET4 1.15 05/22/2014    We checked a thyroid ultrasound (06/06/2014): Right thyroid lobe: 7.1 x 2.9 x 3.9 cm.  Solid complex right upper pole nodule with calcifications measures 4.4 x 2.5 x 3.3 cm.  Hypoechoic lower pole nodule measures 1.6 x 1.3 x 1.7 cm. Left thyroid lobe: 4.4 x 0.9 x 1.2 cm.   5 mm upper pole nodule.  5 mm lowerpole nodule. Isthmus  Thickness: 2 mm. No nodules visualized. Lymphadenopathy: None visualized.  IMPRESSION: Bilateral nodules. Dominant 4.4 cm right upper pole complex nodule. Findings meet consensus criteria for biopsy.  FNA of the 2 dominant nodules:  FNA of the 2 right thyroid nodules (06/13/2014): Adequacy Reason Satisfactory For Evaluation. Diagnosis THYROID, FINE NEEDLE ASPIRATION, RLP (SPECIMEN 1 OF 2 COLLECTED ON 06/12/2014) BENIGN. 9BETHESDA CLASS II). FINDINGS CONSISTENT WITH BENIGN FOLLICULAR NODULE. Jimmy Picket MD Pathologist, Electronic Signature (Case signed 06/13/2014) Specimen Clinical Information Hypothyroidism, RLP hypoechoic nodule 1.6 x 1.3 x 1.7cm Source Thyroid, Fine Needle Aspiration, RLP, (Specimen 1 of 2, collected on 06/12/2014)  Adequacy Reason Satisfactory For Evaluation. Diagnosis THYROID, FINE NEEDLE ASPIRATION, RUP (SPECIMEN 2 OF 2, COLLECTED ON 06/12/14): BENIGN. (BETHESDA CLASS II). FINDINGS CONSISTENT WITH BENIGN FOLLICULAR NODULE. Jimmy Picket MD Pathologist, Electronic Signature (Case signed 06/13/2014) Specimen Clinical Information Hypothyroidsim, RUP solid complex nodule with microcalcifications 4.4 x 2.5 x 3.3cm Source Thyroid, Fine Needle Aspiration, RUP, (Specimen 2 of 2, collected on 06/12/2014)  Pt denies: - feeling nodules in neck - hoarseness - dysphagia - choking - SOB with lying down  She has sore throat from chronic post nasal drip.   She has + FH of thyroid disorders in: hyperthyroidism. No FH of thyroid cancer. No h/o radiation tx to head or neck.  No seaweed or kelp. No recent contrast studies. No herbal supplements. No Biotin use.  No recent steroids use.  She walks for exercise daily  ROS: Constitutional: no weight gain/+ weight loss, + fatigue, no subjective hyperthermia, no subjective hypothermia, + nocturia, + poor sleep Eyes: no blurry vision, no xerophthalmia ENT: no sore throat, + see HPI Cardiovascular: no CP/no SOB/+  palpitations/no leg swelling Respiratory: no cough/no SOB/no wheezing Gastrointestinal: no N/no V/no D/no C/no acid reflux Musculoskeletal: no muscle aches/no joint aches Skin: no rashes, no hair loss Neurological: no tremors/no numbness/no tingling/no dizziness  I reviewed pt's medications, allergies, PMH, social hx, family hx, and changes were documented in the history of present illness. Otherwise, unchanged from my initial visit note.  Past Medical History:  Diagnosis Date  . Anal fissure   . Endometriosis   . Hyperthyroidism    Past Surgical History:  Procedure Laterality Date  . LAPAROSCOPY     x2   History   Social History  . Marital Status: Married    Spouse Name: N/A    Number of Children: 2   Occupational History  . none   Social History Main Topics  . Smoking status: Former Games developer, quit in 1999  . Smokeless tobacco: No  . Alcohol Use: No  . Drug Use: No   Current Outpatient Medications on File Prior to Visit  Medication Sig Dispense Refill  . loratadine (CLARITIN) 10 MG tablet Take 10 mg by mouth daily.     No current facility-administered medications on file prior to visit.    Allergies  Allergen Reactions  . Cephalexin     REACTION: rash   Family History  Problem Relation Age of Onset  . Stroke Mother   . Hypothyroidism Mother   . Hypertension Mother   . Hypertension Father   . Depression Brother   . Colon cancer Brother        age 6    PE: BP 110/68   Pulse 86   Temp 98.1 F (36.7 C)   Ht 5' 5.5" (1.664 m) Comment: measured  Wt 121 lb (54.9 kg)   LMP 12/23/2011   SpO2 99%   BMI 19.83 kg/m  Body mass index is 19.83 kg/m.  Wt Readings from Last 3 Encounters:  11/09/18 121 lb (54.9 kg)  11/02/18 119 lb 6.4 oz (54.2 kg)  11/01/18 121 lb 1.6 oz (54.9 kg)   Constitutional: normal weight, in NAD Eyes: PERRLA, EOMI, no exophthalmos ENT: moist mucous membranes, + left-sided pea-sized thyroid nodule palpated, no cervical  lymphadenopathy Cardiovascular: RRR, No MRG Respiratory: CTA B Gastrointestinal: abdomen soft, NT, ND, BS+ Musculoskeletal: no deformities, strength intact in all 4 Skin: moist, warm, no rashes Neurological: no tremor with outstretched hands, DTR normal in all 4  ASSESSMENT: 1. H/o Graves ds.  2. H/o transient Hypothyroidism  3.  Thyroid nodules  4.  Fatigue  PLAN:  1. History of Graves' disease  And 2.  History of transient hypothyroidism -Diagnosed ~25 years ago,s/p RAI ablation and subsequent hypothyroidism, previously on levothyroxine therapy, but now euthyroid, off levothyroxine.   -She denies hypothyroid symptoms -At this visit, we will not repeat her TFTs since latest set was normal in 09/2018  3.  Thyroid nodules -We reviewed together the results of her 2015 thyroid ultrasound: She has 2 dominant right thyroid nodules, which were biopsied at that time and were benign. -No neck compression symptoms -However, she mentions that when she saw her PCP in 09/2018, a neck mass was palpated and she was referred back to see me at that time.  Palpating  her neck at this visit, palpating the area the patient indicates at the site of the thyroid mass, this appears to be her anterior prominence of her thyroid cartilage (Adam's apple) however, she has a more palpable left-sided thyroid nodule (which is now pea-sized) -We discussed to repeat another ultrasound.  We discussed that if the nodules changed ultrasound characteristics or changed significantly in size, we may need another biopsy.  3.  Fatigue -This is associated with a series of possibly hyperthyroid symptoms, however, her TFTs were perfectly normal in 09/2018.  Since she has a history of autoimmune disease, I would like to check her for adrenal insufficiency.  We will check an ACTH and cortisol level this morning.  Orders Placed This Encounter  Procedures  . US THYROID  . ACTH  . Cortisol   Office Visit on 11/09/2018   Component Date Value Ref Range Status  . R154 ACTH 11/09/2018 14  6 - 50 pg/mL Final   Comment: Reference range applies only to specimens collected between 7am-10am   . Cortisol, Plasma 11/09/2018 10.0  ug/dL Final   AM:  4.3 - 00.8 ug/dLPM:  3.1 - 16.7 ug/dL   Normal cortisol and ACTH.  Reading Physician Reading Date Result Priority  Simonne Come, MD 11/14/2018     Narrative & Impression    CLINICAL DATA:  Prior ultrasound follow-up. History of right-sided thyroid nodule fine-needle aspiration 06/12/2014  EXAM: THYROID ULTRASOUND  TECHNIQUE: Ultrasound examination of the thyroid gland and adjacent soft tissues was performed.  COMPARISON:  06/06/2014; ultrasound-guided biopsy of right-sided thyroid nodules-06/12/2014  FINDINGS: Parenchymal Echotexture: Mildly heterogenous  Isthmus: Normal in size measures 0.5 cm in diameter unchanged  Right lobe: Enlarged measuring 7.2 x 2.6 x 3 2 cm, unchanged from previously 7.1 x 2.9 x 3.9 cm  Left lobe: Normal in size measuring 4.4 x 1.5 x 1.2 cm, unchanged, previously, 4.4 x 0.9 x 1.2 cm  _________________________________________________________  Estimated total number of nodules >/= 1 cm: 2  Number of spongiform nodules >/=  2 cm not described below (TR1): 0  Number of mixed cystic and solid nodules >/= 1.5 cm not described below (TR2): 0  _________________________________________________________  The previously biopsied approximately 4.4 x 3.4 x 2.5 cm nodule (labeled 1) within the mid aspect of the right lobe of the thyroid is unchanged compared to the 05/2014 examination, previously, 4.4 x 3.3 x 2.5 cm. Correlation with prior biopsy results is recommended.  The previously biopsied approximately 1.9 x 1.7 x 1.3 cm hypoechoic nodule within the inferior pole of the right lobe of the thyroid (labeled 2) is unchanged compared to the 05/2014 examination, previously, 1.7 x 1.6 x 1.3 cm. Correlation with prior  biopsy results is recommended.  The approximately 0.6 x 0.5 x 0.5 cm isoechoic nodule within the mid, inferior aspect of the left lobe of the thyroid (labeled 3), is unchanged compared to the 05/2014 examination previously, 0.5 x 0.4 x 0.4 cm, and again does not meet imaging criteria to recommend percutaneous sampling or continued dedicated follow-up.  The approximately 0.4 cm hypoechoic nodule within mid aspect of the right lobe of the thyroid (labeled 4), is unchanged compared to the 05/2014 examination, previously, 0.5 cm, and again does not meet imaging criteria to recommend percutaneous sampling or continued dedicated follow-up.  IMPRESSION: 1. Similar findings of multinodular goiter. No worrisome new or enlarging thyroid nodules. 2. Previously biopsied right-sided thyroid nodules (labeled 1 and 2) are unchanged compared to the 05/2014 examination. Correlation with prior biopsy  results is recommended. Assuming a benign pathologic diagnosis, repeat sampling and/or continued dedicated follow-up is not recommended.  The above is in keeping with the ACR TI-RADS recommendations - J Am Coll Radiol 2017;14:587-595.   Electronically Signed   By: Simonne Come M.D.   On: 11/14/2018 13:41   Stable U/S.  Carlus Pavlov, MD PhD [facility]

## 2018-11-11 LAB — ACTH: C206 ACTH: 14 pg/mL (ref 6–50)

## 2018-11-14 ENCOUNTER — Ambulatory Visit
Admission: RE | Admit: 2018-11-14 | Discharge: 2018-11-14 | Disposition: A | Discharge status: Home / Self Care | Payer: BLUE CROSS/BLUE SHIELD | Source: Ambulatory Visit | Attending: Internal Medicine | Admitting: Internal Medicine

## 2018-11-14 DIAGNOSIS — E042 Nontoxic multinodular goiter: Secondary | ICD-10-CM

## 2018-11-15 NOTE — Progress Notes (Signed)
Reviewed and agree with documentation and assessment and plan. K. Veena Tyric Rodeheaver , MD   

## 2018-11-29 ENCOUNTER — Telehealth: Payer: Self-pay | Admitting: Gastroenterology

## 2018-11-29 NOTE — Telephone Encounter (Signed)
Pt called looking to schedule colonoscopy, she saw Victorino Dike on 11/02/18 because she was experiencing diarrhea but by the time of her appt it had resolved. Pt states that diarrhea has come back since 3 days ago and would like to schedule procedure. Pls advise on scheduling.

## 2018-11-29 NOTE — Telephone Encounter (Signed)
Patient reports increase in the number of stools to 3 a day in the morning. Loose stools that are not watery yet. Afebrile. No antibiotics since her last office visit. No bloody stools. Appetite is normal. She endorses that she focuses on fiber with her diet. Stress at home unchanged. She has not been started on anti-anxiety or anti-depressants. She is not certain the need for a colonoscopy is "urgent." She wanted to get your advice.

## 2018-11-30 MED ORDER — COLESEVELAM HCL 625 MG PO TABS: 625.0000 mg | ORAL_TABLET | Freq: Every day | ORAL | 2 refills | Status: DC

## 2018-11-30 NOTE — Telephone Encounter (Signed)
Discussed with the patient. States she has been lactose free for a while. She will continue. Agrees to try the fiber supplement and Welchol. Follow up by virtual visit.

## 2018-11-30 NOTE — Telephone Encounter (Signed)
Continue fiber (benefiber  1 teaspoon three times daily with meals).  Lactose free diet Start Welchol 1 tablet daily. It is recommended to delay the procedure for few weeks due to COVID 19 based on current GI society guidelines. Schedule office virtual visit next available. Thanks

## 2019-02-21 ENCOUNTER — Encounter: Payer: Self-pay | Admitting: Gastroenterology

## 2019-03-23 ENCOUNTER — Other Ambulatory Visit: Payer: Self-pay

## 2019-03-23 ENCOUNTER — Ambulatory Visit: Payer: BC Managed Care – PPO | Admitting: *Deleted

## 2019-03-23 VITALS — Ht 66.0 in | Wt 132.0 lb

## 2019-03-23 DIAGNOSIS — Z800 Family history of malignant neoplasm of digestive organs: Secondary | ICD-10-CM

## 2019-03-23 MED ORDER — SUPREP BOWEL PREP KIT 17.5-3.13-1.6 GM/177ML PO SOLN: 1.0000 | Freq: Once | ORAL | 0 refills | Status: AC

## 2019-03-23 NOTE — Progress Notes (Signed)
Pt states she has pain with an anal fissure and hemorrhoids that bother her- has protrusions after the BM's that is painful   No egg or soy allergy known to patient  No issues with past sedation with any surgeries  or procedures, no intubation problems  No diet pills per patient No home 02 use per patient  No blood thinners per patient  Pt denies issues with constipation  No A fib or A flutter  EMMI video sent to pt's e mail   Pt verified name, DOB, address and insurance during PV today. Pt mailed instruction packet to included paper to complete and mail back to [facility] with addressed and stamped envelope, Emmi video, copy of consent form to read and not return, and instructions. Suprep $15  coupon mailed in packet. PV completed over the phone. Pt encouraged to call with questions or issues   Pt is aware that care partner will wait in the car during procedure; if they feel like they will be too hot to wait in the car; they may wait in the lobby.  We want them to wear a mask (we do not have any that we can provide them), practice social distancing, and we will check their temperatures when they get here.  I did remind patient that their care partner needs to stay in the parking lot the entire time. Pt will wear mask into building.

## 2019-04-05 ENCOUNTER — Telehealth: Payer: Self-pay | Admitting: Gastroenterology

## 2019-04-05 ENCOUNTER — Telehealth: Payer: Self-pay | Admitting: *Deleted

## 2019-04-05 NOTE — Telephone Encounter (Signed)
Called patient back regarding questions for her  Colonoscopy prep tomorrow and questions about her care partner. All questions answered.

## 2019-04-05 NOTE — Telephone Encounter (Signed)
Pt is scheduled for colonoscopy tomorrow and has questions regarding her prep.

## 2019-04-05 NOTE — Telephone Encounter (Signed)

## 2019-04-06 ENCOUNTER — Ambulatory Visit (AMBULATORY_SURGERY_CENTER): Payer: BC Managed Care – PPO | Admitting: Gastroenterology

## 2019-04-06 ENCOUNTER — Other Ambulatory Visit: Payer: Self-pay

## 2019-04-06 ENCOUNTER — Encounter: Payer: Self-pay | Admitting: Gastroenterology

## 2019-04-06 VITALS — BP 145/55 | HR 61 | Temp 96.0°F | Resp 14 | Ht 66.0 in | Wt 132.0 lb

## 2019-04-06 DIAGNOSIS — Z800 Family history of malignant neoplasm of digestive organs: Secondary | ICD-10-CM | POA: Diagnosis not present

## 2019-04-06 DIAGNOSIS — Z1211 Encounter for screening for malignant neoplasm of colon: Secondary | ICD-10-CM | POA: Diagnosis not present

## 2019-04-06 MED ORDER — HYDROCORTISONE ACETATE 25 MG RE SUPP: 25.0000 mg | Freq: Every day | RECTAL | 0 refills | Status: AC

## 2019-04-06 MED ORDER — SODIUM CHLORIDE 0.9 % IV SOLN: 500.0000 mL | Freq: Once | INTRAVENOUS | Status: DC

## 2019-04-06 NOTE — Op Note (Addendum)
[facility] Patient Name: Alison Holloway Procedure Date: 04/06/2019 8:42 AM MRN: [id number] Endoscopist: Mauri Pole , MD Age: 58 Referring MD:  Date of Birth: 02/11/1961 Gender: Female Account #: [id number] Procedure:                Colonoscopy Indications:              Screening in patient at increased risk: Colorectal                            cancer in brother before age 38 Medicines:                Monitored Anesthesia Care Procedure:                Pre-Anesthesia Assessment:                           - Prior to the procedure, a History and Physical                            was performed, and patient medications and                            allergies were reviewed. The patient's tolerance of                            previous anesthesia was also reviewed. The risks                            and benefits of the procedure and the sedation                            options and risks were discussed with the patient.                            All questions were answered, and informed consent                            was obtained. Prior Anticoagulants: The patient has                            taken no previous anticoagulant or antiplatelet                            agents. ASA Grade Assessment: I - A normal, healthy                            patient. After reviewing the risks and benefits,                            the patient was deemed in satisfactory condition to                            undergo the procedure.  After obtaining informed consent, the colonoscope                            was passed under direct vision. Throughout the                            procedure, the patient's blood pressure, pulse, and                            oxygen saturations were monitored continuously. The                            Colonoscope was introduced through the anus and                            advanced to the the terminal ileum,  with                            identification of the appendiceal orifice and IC                            valve. The colonoscopy was performed without                            difficulty. The patient tolerated the procedure                            well. The quality of the bowel preparation was                            excellent. The terminal ileum, ileocecal valve,                            appendiceal orifice, and rectum were photographed. Scope In: 8:46:54 AM Scope Out: 9:01:14 AM Scope Withdrawal Time: 0 hours 9 minutes 24 seconds  Total Procedure Duration: 0 hours 14 minutes 20 seconds  Findings:                 Hemorrhoids were found on perianal exam.                           Bleeding internal hemorrhoids were found during                            retroflexion. The hemorrhoids were large.                           The exam was otherwise without abnormality. Complications:            No immediate complications. Estimated Blood Loss:     Estimated blood loss was minimal. Impression:               - Hemorrhoids found on perianal exam.                           - Bleeding  internal hemorrhoids.                           - The examination was otherwise normal.                           - No specimens collected. Recommendation:           - Patient has a contact number available for                            emergencies. The signs and symptoms of potential                            delayed complications were discussed with the                            patient. Return to normal activities tomorrow.                            Written discharge instructions were provided to the                            patient.                           - Resume previous diet.                           - Continue present medications.                           - Repeat colonoscopy in 5 years for screening                            purposes.                           - Return to GI clinic  at the next available                            appointment for hemorrhoidal banding as needed.                           - Use Benefiber one teaspoon PO TID.                           - Use hydrocortisone suppository 25 mg 1 per rectum                            once a day for 1 week. Napoleon FormKavitha V. Argusta Mcgann, MD 04/06/2019 9:09:31 AM This report has been signed electronically.

## 2019-04-06 NOTE — Patient Instructions (Addendum)
Handouts given for hemorrhoids and hemorrhoid banding information.  Dr. Woodward Ku office will call to arrange an appointment.  Pick up your new prescription at [facility].  Use Benefiber one teaspoon three times a day.  YOU HAD AN ENDOSCOPIC PROCEDURE TODAY AT [facility]:   Refer to the procedure report that was given to you for any specific questions about what was found during the examination.  If the procedure report does not answer your questions, please call your gastroenterologist to clarify.  If you requested that your care partner not be given the details of your procedure findings, then the procedure report has been included in a sealed envelope for you to review at your convenience later.  YOU SHOULD EXPECT: Some feelings of bloating in the abdomen. Passage of more gas than usual.  Walking can help get rid of the air that was put into your GI tract during the procedure and reduce the bloating. If you had a lower endoscopy (such as a colonoscopy or flexible sigmoidoscopy) you may notice spotting of blood in your stool or on the toilet paper. If you underwent a bowel prep for your procedure, you may not have a normal bowel movement for a few days.  Please Note:  You might notice some irritation and congestion in your nose or some drainage.  This is from the oxygen used during your procedure.  There is no need for concern and it should clear up in a day or so.  SYMPTOMS TO REPORT IMMEDIATELY:   Following lower endoscopy (colonoscopy or flexible sigmoidoscopy):  Excessive amounts of blood in the stool  Significant tenderness or worsening of abdominal pains  Swelling of the abdomen that is new, acute  Fever of 100F or higher  For urgent or emergent issues, a gastroenterologist can be reached at any hour by calling [phone].   DIET:  We do recommend a small meal at first, but then you may proceed to your regular diet.  Drink plenty of fluids but you should avoid  alcoholic beverages for 24 hours.  ACTIVITY:  You should plan to take it easy for the rest of today and you should NOT DRIVE or use heavy machinery until tomorrow (because of the sedation medicines used during the test).    FOLLOW UP: Our staff will call the number listed on your records 48-72 hours following your procedure to check on you and address any questions or concerns that you may have regarding the information given to you following your procedure. If we do not reach you, we will leave a message.  We will attempt to reach you two times.  During this call, we will ask if you have developed any symptoms of COVID 19. If you develop any symptoms (ie: fever, flu-like symptoms, shortness of breath, cough etc.) before then, please call [phone].  If you test positive for Covid 19 in the 2 weeks post procedure, please call and report this information to [place].    If any biopsies were taken you will be contacted by phone or by letter within the next 1-3 weeks.  Please call us at [phone] if you have not heard about the biopsies in 3 weeks.    SIGNATURES/CONFIDENTIALITY: You and/or your care partner have signed paperwork which will be entered into your electronic medical record.  These signatures attest to the fact that that the information above on your After Visit Summary has been reviewed and is understood.  Full responsibility of the confidentiality of this  discharge information lies with you and/or your care-partner. 

## 2019-04-06 NOTE — Progress Notes (Signed)
To PACU, VSS. Report to [email or web address] 

## 2019-04-07 ENCOUNTER — Telehealth: Payer: Self-pay

## 2019-04-07 NOTE — Telephone Encounter (Signed)
Recent colonoscopy recommendations indicate the need of an appointment for hemorrhoid banding. Called to the patient. No answer. Left a message to call back if she is interested and scheduling.

## 2019-04-10 ENCOUNTER — Telehealth: Payer: Self-pay

## 2019-04-10 NOTE — Telephone Encounter (Signed)
Covid-19 screening questions   Do you now or have you had a fever in the last 14 days? No.  Do you have any respiratory symptoms of shortness of breath or cough now or in the last 14 days? No.  Do you have any family members or close contacts with diagnosed or suspected Covid-19 in the past 14 days? No.  Have you been tested for Covid-19 and found to be positive? No.         Follow up Call-  Call back number 04/06/2019  Post procedure Call Back phone  # [phone]  Permission to leave phone message Yes  Some recent data might be hidden     Patient questions:  Do you have a fever, pain , or abdominal swelling? No. Pain Score  0 *  Have you tolerated food without any problems? Yes.    Have you been able to return to your normal activities? Yes.    Do you have any questions about your discharge instructions: Diet   No. Medications  No. Follow up visit  No.  Do you have questions or concerns about your Care? No.  Actions: * If pain score is 4 or above: No action needed, pain <4.

## 2019-04-24 ENCOUNTER — Ambulatory Visit (INDEPENDENT_AMBULATORY_CARE_PROVIDER_SITE_OTHER): Payer: BC Managed Care – PPO | Admitting: Gastroenterology

## 2019-04-24 ENCOUNTER — Other Ambulatory Visit: Payer: Self-pay

## 2019-04-24 ENCOUNTER — Encounter: Payer: Self-pay | Admitting: Gastroenterology

## 2019-04-24 VITALS — BP 112/70 | HR 80 | Temp 97.9°F | Ht 65.5 in | Wt 130.4 lb

## 2019-04-24 DIAGNOSIS — K641 Second degree hemorrhoids: Secondary | ICD-10-CM | POA: Diagnosis not present

## 2019-04-24 DIAGNOSIS — K602 Anal fissure, unspecified: Secondary | ICD-10-CM | POA: Diagnosis not present

## 2019-04-24 MED ORDER — AMBULATORY NON FORMULARY MEDICATION: 1 refills | Status: DC

## 2019-04-24 MED ORDER — HYDROCORTISONE ACETATE 25 MG RE SUPP: 25.0000 mg | Freq: Every evening | RECTAL | 1 refills | Status: DC | PRN

## 2019-04-24 NOTE — Progress Notes (Signed)
Alison MinkLisa P Salome    [id number]    Oct 07, 1960  Primary Care Physician:Panosh, Neta MendsWanda K, MD  Referring Physician: Madelin HeadingsPanosh, Wanda K, MD [address] [place] [id number]   Chief complaint:  BRBPR, Anal discomfort  HPI:  58 year old female here with complaints of anal discomfort worse after bowel movement almost daily and intermittent rectal bleeding.  She has protrusion of hemorrhoids intermittently.  Colonoscopy April 06, 2019 showed large internal hemorrhoids otherwise unremarkable exam   Outpatient Encounter Medications as of 04/24/2019  Medication Sig  . AMBULATORY NON FORMULARY MEDICATION Medication Name: Nitroglycerin 0.125%  ointment Use Pea sized amount per rectum three times a day  . loratadine (CLARITIN) 10 MG tablet Take 10 mg by mouth daily.   No facility-administered encounter medications on file as of 04/24/2019.     Allergies as of 04/24/2019 - Review Complete 04/24/2019  Allergen Reaction Noted  . Cephalexin      Past Medical History:  Diagnosis Date  . Allergy   . Anal fissure   . Endometriosis   . Hyperthyroidism    radioactive treatment    Past Surgical History:  Procedure Laterality Date  . anal fistula surgery    . LAPAROSCOPY     x2- for endometriosis    Family History  Problem Relation Age of Onset  . Stroke Mother   . Hypothyroidism Mother   . Hypertension Mother   . Hypertension Father   . Depression Brother   . Colon cancer Brother        age 59   . Colon polyps Neg Hx   . Esophageal cancer Neg Hx   . Rectal cancer Neg Hx   . Stomach cancer Neg Hx     Social History   Socioeconomic History  . Marital status: Married    Spouse name: Not on file  . Number of children: 2  . Years of education: Not on file  . Highest education level: Not on file  Occupational History  . Not on file  Social Needs  . Financial resource strain: Not on file  . Food insecurity    Worry: Not on file    Inability: Not on  file  . Transportation needs    Medical: Not on file    Non-medical: Not on file  Tobacco Use  . Smoking status: Former Games developermoker  . Smokeless tobacco: Never Used  . Tobacco comment: quit 21 yrs ago   Substance and Sexual Activity  . Alcohol use: Not Currently    Alcohol/week: 0.0 standard drinks    Comment: rare   . Drug use: No  . Sexual activity: Yes    Partners: Male  Lifestyle  . Physical activity    Days per week: Not on file    Minutes per session: Not on file  . Stress: Not on file  Relationships  . Social Musicianconnections    Talks on phone: Not on file    Gets together: Not on file    Attends religious service: Not on file    Active member of club or organization: Not on file    Attends meetings of clubs or organizations: Not on file    Relationship status: Not on file  . Intimate partner violence    Fear of current or ex partner: Not on file    Emotionally abused: Not on file    Physically abused: Not on file    Forced sexual activity:  Not on file  Other Topics Concern  . Not on file  Social History Narrative   7 hours of sleep per night   Lives with her husband and son  h h of 3     Currently not working   1 dog in the home.   g2 p2    Soc: etoh  No tobacco .       Review of systems: Review of Systems  Constitutional: Negative for fever and chills.  HENT: Negative.   Eyes: Negative for blurred vision.  Respiratory: Negative for cough, shortness of breath and wheezing.   Cardiovascular: Negative for chest pain and palpitations.  Gastrointestinal: as per HPI Genitourinary: Negative  Musculoskeletal: Negative for myalgias, back pain and joint pain.  Skin: Negative for itching and rash.  Neurological: Negative for dizziness, tremors, focal weakness, seizures and loss of consciousness.  Endo/Heme/Allergies: Negative Psychiatric/Behavioral: Negative  All other systems reviewed and are negative.   Physical Exam: Vitals:   04/24/19 1541  BP: 112/70   Pulse: 80  Temp: 97.9 F (36.6 C)   Body mass index is 21.37 kg/m. Gen:      No acute distress HEENT:  EOMI, sclera anicteric Neck:     No masses; no thyromegaly Lungs:    Clear to auscultation bilaterally; normal respiratory effort CV:         Regular rate and rhythm; no murmurs Abd:      + bowel sounds; soft, non-tender; no palpable masses, no distension Ext:    No edema; adequate peripheral perfusion Skin:      Warm and dry; no rash Neuro: alert and oriented x 3 Psych: normal mood and affect Rectal exam: Increased anal sphincter tone, + anal fissure , no external hemorrhoids Anoscopy: Grade 2-3 internal hemorrhoids, 2 small superficial anal fissure right posterior and left lateral Data Reviewed:  Reviewed labs, radiology imaging, old records and pertinent past GI work up   Assessment and Plan/Recommendations:  58 year-old female with symptomatic grade 2-3 internal hemorrhoids and anal fissure Start applying pea-sized amount 0.125% nitroglycerin per rectum 3 times daily for 6 to 8 weeks Benefiber 1 tablespoon twice daily Anusol suppository per rectum at bedtime as needed  Return in 2 months for follow-up and hemorrhoidal band ligation as needed    K. Denzil Magnuson , MD    CC: Panosh, Standley Brooking, MD

## 2019-04-24 NOTE — Patient Instructions (Signed)
We have sent a prescription for nitroglycerin 0.125% gel to [facility]. You should apply a pea size amount to your rectum three times daily x 6-8 weeks.  [facility]'s information is below: Address: [address], [place], [place] [address]  [phone]  *Please DO NOT go directly from our office to pick up this medication! Give the pharmacy 1 day to process the prescription as this is compounded and takes time to make.  We have sent Anusol Suppositories to your pharmacy  Take Benefiber 1 tablespoon twice a day   Follow up in 2 months  I appreciate the  opportunity to care for you  Thank You   Harl Bowie , MD

## 2019-05-23 DIAGNOSIS — Z01419 Encounter for gynecological examination (general) (routine) without abnormal findings: Secondary | ICD-10-CM | POA: Diagnosis not present

## 2019-05-23 DIAGNOSIS — Z1231 Encounter for screening mammogram for malignant neoplasm of breast: Secondary | ICD-10-CM | POA: Diagnosis not present

## 2019-05-23 DIAGNOSIS — Z6821 Body mass index (BMI) 21.0-21.9, adult: Secondary | ICD-10-CM | POA: Diagnosis not present

## 2019-05-23 LAB — HM MAMMOGRAPHY

## 2019-06-05 ENCOUNTER — Other Ambulatory Visit: Payer: Self-pay | Admitting: *Deleted

## 2019-06-05 MED ORDER — HYDROCORTISONE ACETATE 25 MG RE SUPP: 25.0000 mg | Freq: Every evening | RECTAL | 1 refills | Status: DC | PRN

## 2019-06-21 ENCOUNTER — Encounter: Payer: Self-pay | Admitting: Internal Medicine

## 2019-06-25 IMAGING — US US THYROID
1 series · 13 of 25 positions shown · non-contrast
Comparison: 06/06/2014; ultrasound-guided biopsy of right-sided
thyroid nodules-06/12/2014

CLINICAL DATA: Prior ultrasound follow-up. History of right-sided
thyroid nodule fine-needle aspiration 06/12/2014

EXAM:
THYROID ULTRASOUND
TECHNIQUE: Ultrasound examination of the thyroid gland and adjacent soft
tissues was performed.

[Series 1: us thyroid · 0.06mm/px · 13 of 40 slices shown]
[im 1/40]
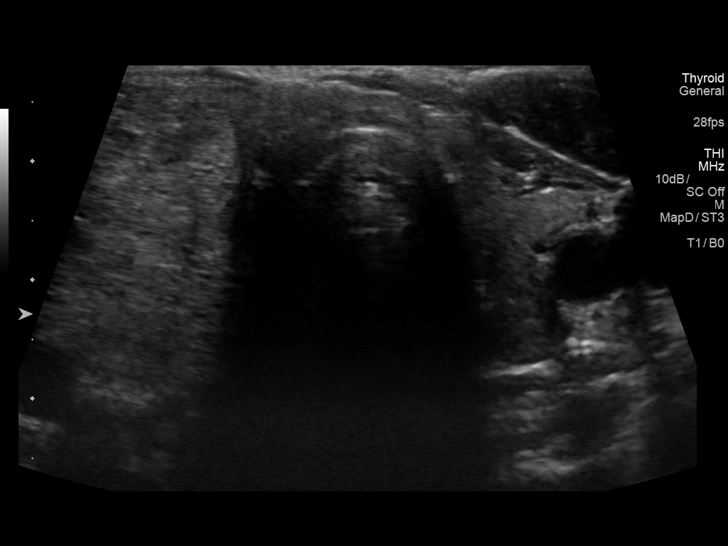
[im 4/40]
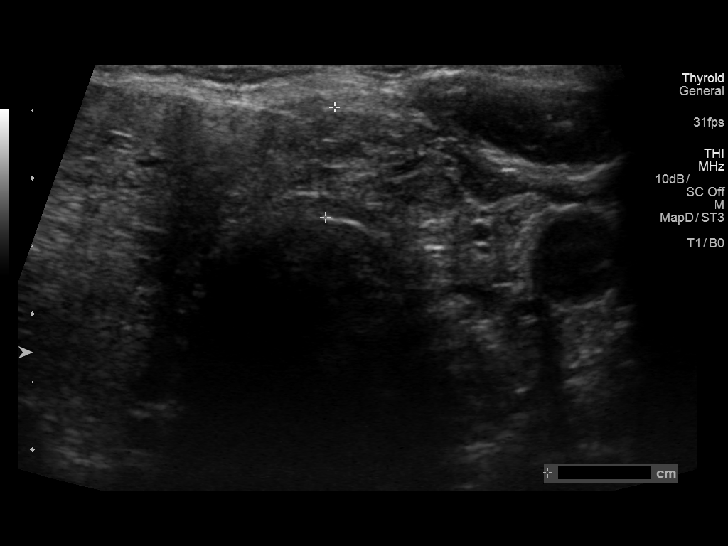
[im 7/40]
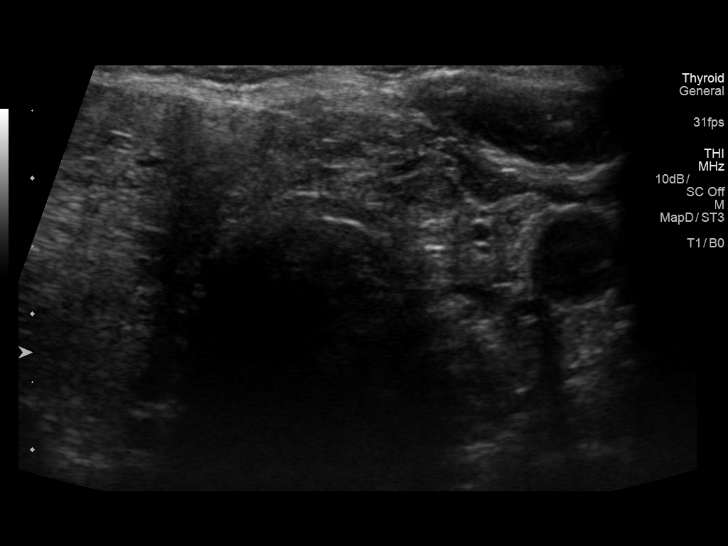
[im 10/40]
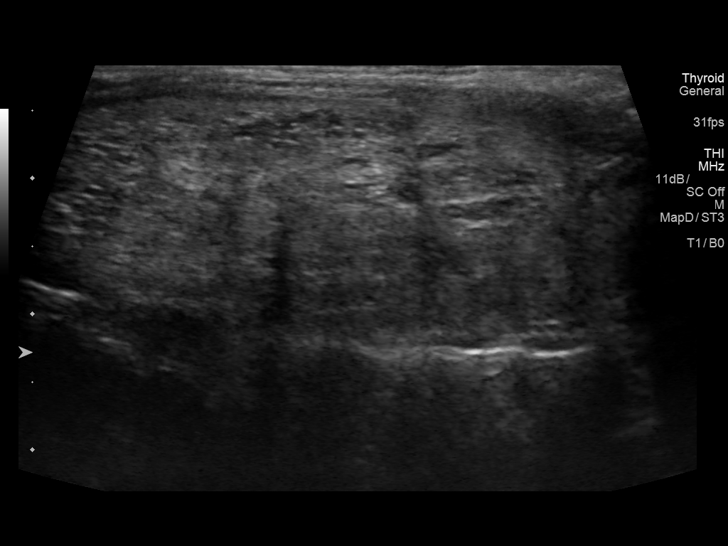
[im 14/40]
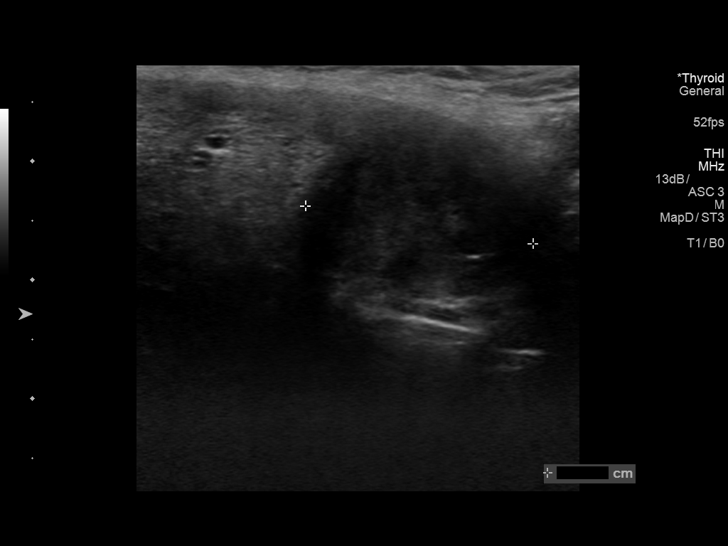
[im 17/40]
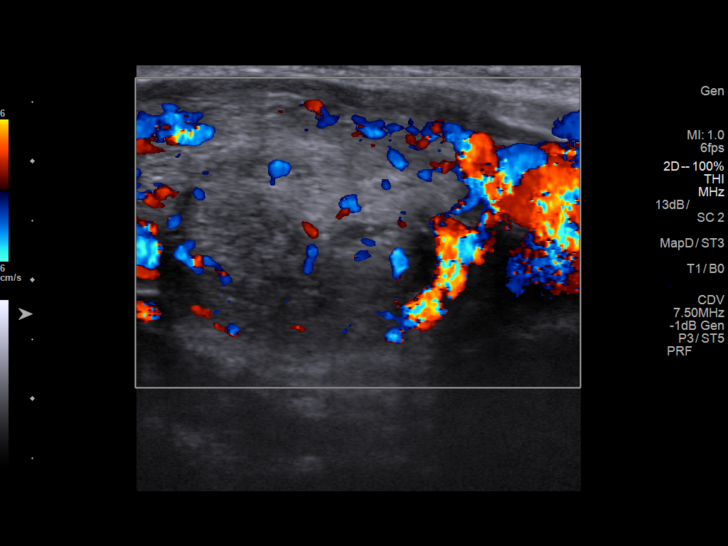
[im 20/40]
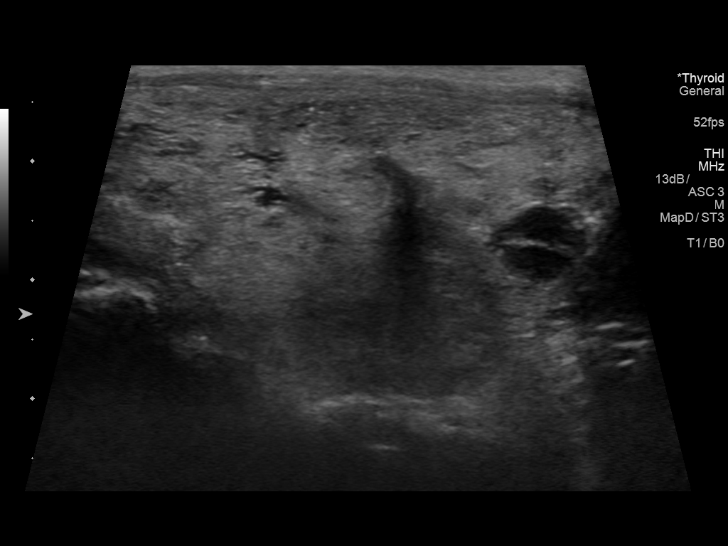
[im 23/40]
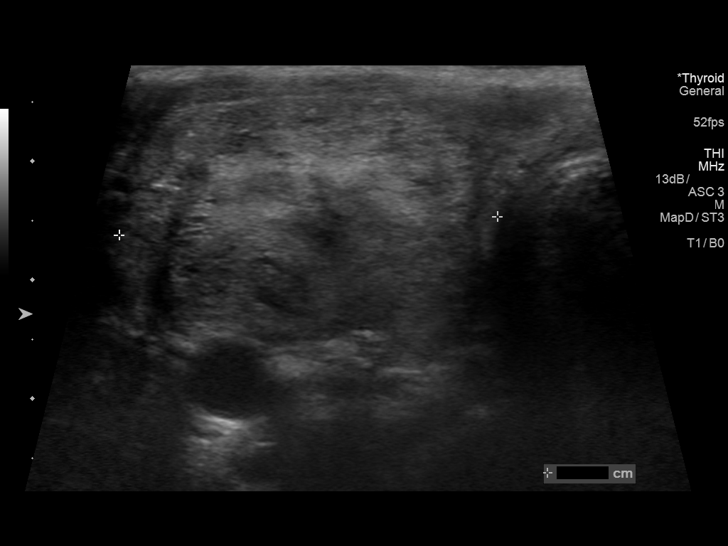
[im 27/40]
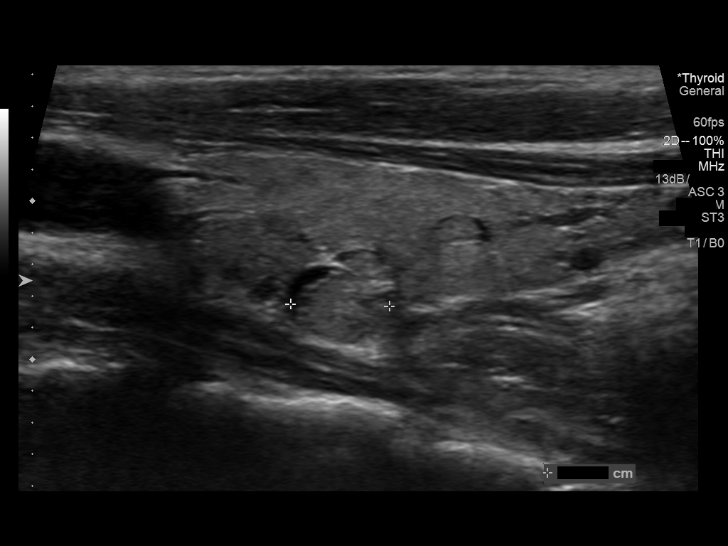
[im 30/40]
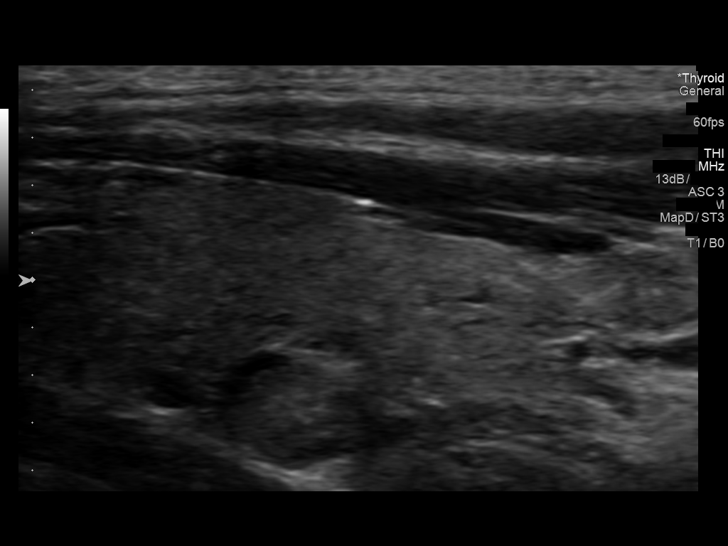
[im 33/40]
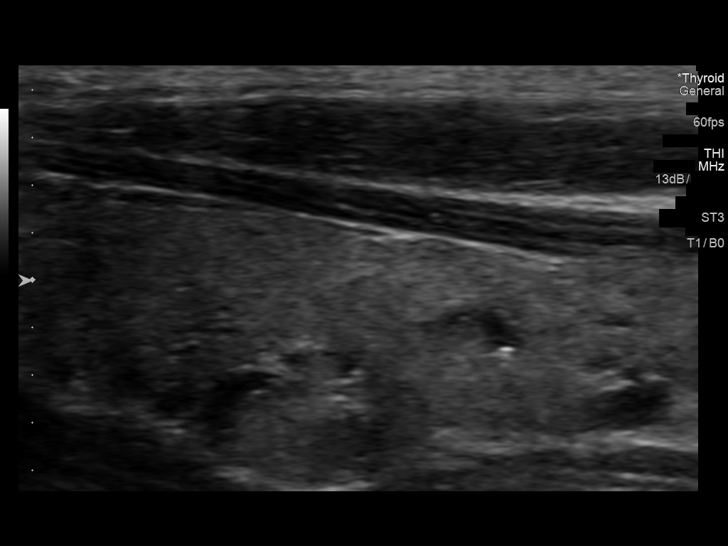
[im 36/40]
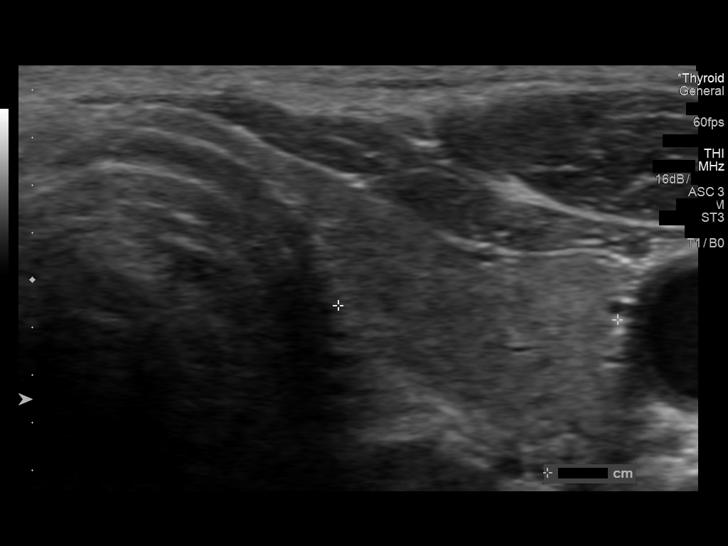
[im 40/40]
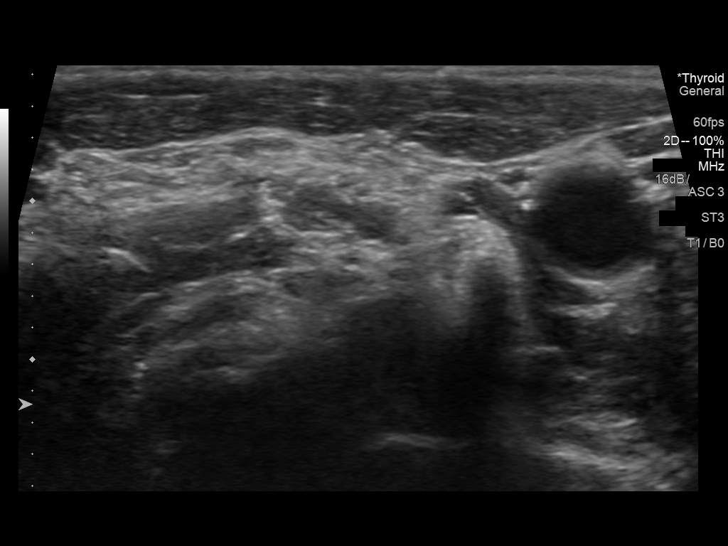

[13 of 25 positions shown; findings below may reference images not displayed]

FINDINGS: Parenchymal Echotexture: Mildly heterogenous

Isthmus: Normal in size measures 0.5 cm in diameter unchanged

Right lobe: Enlarged measuring 7.2 x 2.6 x 3 2 cm, unchanged from
previously 7.1 x 2.9 x 3.9 cm

Left lobe: Normal in size measuring 4.4 x 1.5 x 1.2 cm, unchanged,
previously, 4.4 x 0.9 x 1.2 cm

_________________________________________________________

Estimated total number of nodules >/= 1 cm: 2

Number of spongiform nodules >/=  2 cm not described below (TR1): 0

Number of mixed cystic and solid nodules >/= 1.5 cm not described
below (TR2): 0

_________________________________________________________

The previously biopsied approximately 4.4 x 3.4 x 2.5 cm nodule
(labeled 1) within the mid aspect of the right lobe of the thyroid
is unchanged compared to the [DATE] examination, previously, 4.4 x
3.3 x 2.5 cm. Correlation with prior biopsy results is recommended.

The previously biopsied approximately 1.9 x 1.7 x 1.3 cm hypoechoic
nodule within the inferior pole of the right lobe of the thyroid
(labeled 2) is unchanged compared to the [DATE] examination,
previously, 1.7 x 1.6 x 1.3 cm. Correlation with prior biopsy
results is recommended.

The approximately 0.6 x 0.5 x 0.5 cm isoechoic nodule within the
mid, inferior aspect of the left lobe of the thyroid (labeled 3), is
unchanged compared to the [DATE] examination previously, 0.5 x
x 0.4 cm, and again does not meet imaging criteria to recommend
percutaneous sampling or continued dedicated follow-up.

The approximately 0.4 cm hypoechoic nodule within mid aspect of the
right lobe of the thyroid (labeled 4), is unchanged compared to the
[DATE] examination, previously, 0.5 cm, and again does not meet
imaging criteria to recommend percutaneous sampling or continued
dedicated follow-up.
IMPRESSION: 1. Similar findings of multinodular goiter. No worrisome new or
enlarging thyroid nodules.
2. Previously biopsied right-sided thyroid nodules (labeled 1 and 2)
are unchanged compared to the [DATE] examination. Correlation with
prior biopsy results is recommended. Assuming a benign pathologic
diagnosis, repeat sampling and/or continued dedicated follow-up is
not recommended.

The above is in keeping with the ACR TI-RADS recommendations - [HOSPITAL] 4323;[DATE].

## 2019-07-07 ENCOUNTER — Ambulatory Visit: Payer: BC Managed Care – PPO | Admitting: Gastroenterology

## 2019-07-22 NOTE — Progress Notes (Signed)
07/22/2019 Alison Holloway [id number] 05-15-1961   History of Present Illness: Alison Holloway. Alison Holloway is a 58 year old female with a past medical history of Graves' disease 1996 s/p RAI tx 2000 then hypothyroidism. Her brother was diagnosed with colon cancer at the age of 53. She underwent a colonoscopy by Dr. Lavon Paganini 04/06/2019 which showed internal hemorrhoids, no polyps. She was prescribed fiber and Hydrocortisone suppositories. She was seen in office by Dr. Lavon Paganini 04/24/2019 to have her internal hemorrhoids banded.  However, at that time she was assessed to have a right posterior and left lateral anal fissures therefore hemorrhoidal banding was deferred.  She was prescribed nitroglycerin 0.125% ointment applied to the anal rectal area 3 times daily for 6 to 8 weeks.  She is intolerant to Benefiber which causes stomach upset.  She presents today for further follow-up.  She stated her anal rectal pain has significantly improved.  She has intermittent rectal pressure discomfort.  She infrequently sees a scant amount of blood on the toilet tissue, last occurred 2 weeks ago.  She ran out of the nitroglycerin ointment 4 days ago and she questioned if she needed to continue this treatment.  She eats a high-fiber diet.  She drinks at least 8 glasses of water daily.  She takes a stool softener as needed.  MiraLAX was ineffective.  No other complaints today.   Colonoscopy 04/06/2019 by Dr. Lavon Paganini: Hemorrhoids were found on perianal exam. - Bleeding internal hemorrhoids were found during retroflexion. The hemorrhoids were large. - The exam was otherwise without abnormality  Current Medications, Allergies, Past Medical History, Past Surgical History, Family History and Social History were reviewed in [employer] record.   Physical Exam: LMP 12/23/2011   BP 128/76 (BP Location: Left Arm, Patient Position: Sitting, Cuff Size: Normal)   Pulse 80   Temp 98.1 F (36.7 C)   Ht 5' 5.5"  (1.664 m)   Wt 133 lb 4 oz (60.4 kg)   LMP 12/23/2011   BMI 21.84 kg/m   General: Well developed 58 year old female in no acute distress Head: Normocephalic and atraumatic Eyes:  sclerae anicteric, conjunctiva pink  Ears: Normal auditory acuity Lungs: Clear throughout to auscultation Heart: Regular rate and rhythm Abdomen: Soft, non tender and non distended. No masses, no hepatomegaly. Normal bowel sounds Rectal: Thickened perianal folds, the anal mucosa is beefy red, small left lateral fissure is nontender, right posterior fissure is mostly healed, moderate internal hemorrhoids palpated without current prolapse Musculoskeletal: Symmetrical with no gross deformities  Extremities: No edema  Neurological: Alert oriented x 4, grossly nonfocal Psychological:  Alert and cooperative. Normal mood and affect  Assessment and Recommendations:  34.  58 year old female with internal hemorrhoids -Schedule follow-up appoint with Dr. Lavon Paganini in 6 weeks, potential hemorrhoid banding if fissure is completely healed at that time  2.  Small anal fissures improving after 12 weeks of Nitroglycerin ointment 3 times daily.  Rectal exam shows thickened anal folds with markedly erythematous anal mucosa -I discussed a follow-up appoint with Dr. Lavon Paganini to reassess the anal mucosa and fissures off of nitroglycerin in 6 weeks. She did not wish to see a colorectal surgeon for further evaluation at this point. -Desitin apply small amount inside the anal opening into the external anal area 3 times daily for the next 2-4 weeks -Stool softener preference as needed -Patient to call our office if her symptoms worsen   3.  IBS  4. Family history of colon cancer -  next colonoscopy due 03/2024

## 2019-07-24 ENCOUNTER — Ambulatory Visit (INDEPENDENT_AMBULATORY_CARE_PROVIDER_SITE_OTHER): Payer: BC Managed Care – PPO | Admitting: Nurse Practitioner

## 2019-07-24 ENCOUNTER — Encounter: Payer: Self-pay | Admitting: Nurse Practitioner

## 2019-07-24 VITALS — BP 128/76 | HR 80 | Temp 98.1°F | Ht 65.5 in | Wt 133.2 lb

## 2019-07-24 DIAGNOSIS — K602 Anal fissure, unspecified: Secondary | ICD-10-CM | POA: Diagnosis not present

## 2019-07-24 NOTE — Progress Notes (Signed)
Reviewed and agree with documentation and assessment and plan. K. Veena Nandigam , MD   

## 2019-07-24 NOTE — Patient Instructions (Addendum)
If you are age 59 or older, your body mass index should be between 23-30. Your Body mass index is 21.84 kg/m. If this is out of the aforementioned range listed, please consider follow up with your Primary Care Provider.  If you are age 79 or younger, your body mass index should be between 19-25. Your Body mass index is 21.84 kg/m. If this is out of the aformentioned range listed, please consider follow up with your Primary Care Provider.   Desitin - apply a small amount inside the anal area three times a day for the next 2-4 weeks.   Follow up with Dr. Silverio Decamp on 09-01-2019 @ 830am   If symptoms worsen please call the office. [phone]  It was a pleasure to see you today!

## 2019-09-01 ENCOUNTER — Ambulatory Visit: Payer: BC Managed Care – PPO | Admitting: Gastroenterology

## 2019-09-06 ENCOUNTER — Ambulatory Visit (INDEPENDENT_AMBULATORY_CARE_PROVIDER_SITE_OTHER): Payer: BC Managed Care – PPO | Admitting: Gastroenterology

## 2019-09-06 ENCOUNTER — Encounter: Payer: Self-pay | Admitting: Gastroenterology

## 2019-09-06 VITALS — BP 122/68 | HR 80 | Temp 98.1°F | Ht 65.5 in | Wt 137.1 lb

## 2019-09-06 DIAGNOSIS — K6289 Other specified diseases of anus and rectum: Secondary | ICD-10-CM | POA: Diagnosis not present

## 2019-09-06 DIAGNOSIS — K641 Second degree hemorrhoids: Secondary | ICD-10-CM

## 2019-09-06 DIAGNOSIS — K625 Hemorrhage of anus and rectum: Secondary | ICD-10-CM

## 2019-09-06 MED ORDER — HYDROCORTISONE ACETATE 25 MG RE SUPP: 25.0000 mg | Freq: Every day | RECTAL | 1 refills | Status: DC

## 2019-09-06 NOTE — Patient Instructions (Signed)

## 2019-09-06 NOTE — Progress Notes (Signed)
Alison Holloway    [id number]    [birth year]  Primary Care Physician:Panosh, Neta Mends, MD  Referring Physician: Madelin Headings, MD [address] [place],  [place] [address]   Chief complaint: BRBPR, anorectal discomfort  HPI:  59 year old female with history of internal hemorrhoids and anal fissure here for follow-up visit. Symptoms improved after she used nitroglycerin and feels the fissure has healed but in the past few weeks she is having increased anal discomfort.  She is noticing bright red blood per rectum once in a while but denies it with every bowel movement.  She is having soft to 3 bowel movements daily, denies excessive straining.  Colonoscopy April 06, 2019 showed large internal hemorrhoids otherwise unremarkable exam   Outpatient Encounter Medications as of 09/06/2019  Medication Sig  . loratadine (CLARITIN) 10 MG tablet Take 10 mg by mouth daily.  . [DISCONTINUED] AMBULATORY NON FORMULARY MEDICATION Medication Name: Nitroglycerin 0.125%  ointment Use Pea sized amount per rectum three times a day (Patient not taking: Reported on 07/24/2019)  . [DISCONTINUED] hydrocortisone (ANUSOL-HC) 25 MG suppository Place 1 suppository (25 mg total) rectally at bedtime as needed for hemorrhoids or anal itching. (Patient not taking: Reported on 07/24/2019)   No facility-administered encounter medications on file as of 09/06/2019.    Allergies as of 09/06/2019 - Review Complete 09/06/2019  Allergen Reaction Noted  . Keflex [cephalexin]      Past Medical History:  Diagnosis Date  . Allergy   . Anal fissure   . Endometriosis   . Hyperthyroidism    radioactive treatment    Past Surgical History:  Procedure Laterality Date  . anal fistula surgery    . LAPAROSCOPY     x2- for endometriosis    Family History  Problem Relation Age of Onset  . Stroke Mother   . Hypothyroidism Mother   . Hypertension Mother   . Hypertension Father   . Depression Brother    . Colon cancer Brother        age 78   . Colon polyps Neg Hx   . Esophageal cancer Neg Hx   . Rectal cancer Neg Hx   . Stomach cancer Neg Hx     Social History   Socioeconomic History  . Marital status: Married    Spouse name: Not on file  . Number of children: 2  . Years of education: Not on file  . Highest education level: Not on file  Occupational History  . Not on file  Tobacco Use  . Smoking status: Former Games developer  . Smokeless tobacco: Never Used  . Tobacco comment: quit 21 yrs ago   Substance and Sexual Activity  . Alcohol use: Not Currently    Alcohol/week: 0.0 standard drinks    Comment: rare   . Drug use: No  . Sexual activity: Yes    Partners: Male  Other Topics Concern  . Not on file  Social History Narrative   7 hours of sleep per night   Lives with her husband and son  h h of 3     Currently not working   1 dog in the home.   g2 p2    Soc: etoh  No tobacco .    Social Determinants of Health   Financial Resource Strain:   . Difficulty of Paying Living Expenses: Not on file  Food Insecurity:   . Worried About Programme researcher, broadcasting/film/video in the  Last Year: Not on file  . Ran Out of Food in the Last Year: Not on file  Transportation Needs:   . Lack of Transportation (Medical): Not on file  . Lack of Transportation (Non-Medical): Not on file  Physical Activity:   . Days of Exercise per Week: Not on file  . Minutes of Exercise per Session: Not on file  Stress:   . Feeling of Stress : Not on file  Social Connections:   . Frequency of Communication with Friends and Family: Not on file  . Frequency of Social Gatherings with Friends and Family: Not on file  . Attends Religious Services: Not on file  . Active Member of Clubs or Organizations: Not on file  . Attends Banker Meetings: Not on file  . Marital Status: Not on file  Intimate Partner Violence:   . Fear of Current or Ex-Partner: Not on file  . Emotionally Abused: Not on file  . Physically  Abused: Not on file  . Sexually Abused: Not on file      Review of systems: Review of Systems  Constitutional: Negative for fever and chills.  HENT: Negative.   Eyes: Negative for blurred vision.  Respiratory: Negative for cough, shortness of breath and wheezing.   Cardiovascular: Negative for chest pain and palpitations.  Gastrointestinal: as per HPI Genitourinary: Negative for dysuria, urgency, frequency and hematuria.  Musculoskeletal: Negative for myalgias, back pain and joint pain.  Skin: Negative for itching and rash.  Neurological: Negative for dizziness, tremors, focal weakness, seizures and loss of consciousness.  Endo/Heme/Allergies: Positive for seasonal allergies.  Psychiatric/Behavioral: Negative for depression, suicidal ideas and hallucinations.  All other systems reviewed and are negative.   Physical Exam: Vitals:   09/06/19 1128  BP: 122/68  Pulse: 80  Temp: 98.1 F (36.7 C)   Body mass index is 22.47 kg/m. Gen:      No acute distress HEENT:  EOMI, sclera anicteric Abd:      + bowel sounds; soft, non-tender; no palpable masses, no distension Ext:    No edema; adequate peripheral perfusion Skin:      Warm and dry; no rash Neuro: alert and oriented x 3 Psych: normal mood and affect  Data Reviewed:  Reviewed labs, radiology imaging, old records and pertinent past GI work up   Assessment and Plan/Recommendations:  In the Left Lateral Decubitus position anoscopic examination revealed grade II large inflamed internal hemorrhoids in the right posterior, right anterior and left lateral position(s).   No anal fissure.  No external hemorrhoids, has anterior skin tag. The anorectum was pre-medicated with 0.125% nitroglycerin and RectiCare  PROCEDURE NOTE: The patient presents with symptomatic grade 2 hemorrhoids, requesting rubber band ligation of his/her hemorrhoidal disease.  All risks, benefits and alternative forms of therapy were described and informed  consent was obtained.  The decision was made to band the right posterior internal hemorrhoid, and the [facility] O'Regan System was used to perform band ligation without complication.  Digital anorectal examination was then performed to assure proper positioning of the band, and to adjust the banded tissue as required.  The patient was discharged home without pain or other issues.  Dietary and behavioral recommendations were given and along with follow-up instructions.     The patient will return in 2 to 4 weeks for  follow-up and possible additional banding as required. No complications were encountered and the patient tolerated the procedure well.  This visit required 20 minutes of patient care (this includes  precharting, chart review, review of results, face-to-face time used for counseling as well as treatment plan and follow-up. The patient was provided an opportunity to ask questions and all were answered. The patient agreed with the plan and demonstrated an understanding of the instructions.  Damaris Hippo , MD    CC: Panosh, Standley Brooking, MD

## 2019-09-25 ENCOUNTER — Ambulatory Visit (INDEPENDENT_AMBULATORY_CARE_PROVIDER_SITE_OTHER): Payer: BC Managed Care – PPO | Admitting: Gastroenterology

## 2019-09-25 ENCOUNTER — Encounter: Payer: Self-pay | Admitting: Gastroenterology

## 2019-09-25 VITALS — BP 124/60 | Temp 97.9°F | Ht 65.5 in | Wt 139.0 lb

## 2019-09-25 DIAGNOSIS — K641 Second degree hemorrhoids: Secondary | ICD-10-CM | POA: Diagnosis not present

## 2019-09-25 NOTE — Progress Notes (Signed)
PROCEDURE NOTE: The patient presents with symptomatic grade II hemorrhoids, requesting rubber band ligation of his/her hemorrhoidal disease.  All risks, benefits and alternative forms of therapy were described and informed consent was obtained.  The anorectum was pre-medicated with 0.125% NTG and Recticare The decision was made to band the Left lateral internal hemorrhoid, and the [facility] O'Regan System was used to perform band ligation without complication.  Digital anorectal examination was then performed to assure proper positioning of the band, and to adjust the banded tissue as required.  The patient was discharged home without pain or other issues.  Dietary and behavioral recommendations were given and along with follow-up instructions.     The following adjunctive treatments were recommended:  Benefiber 1 tablespoon TID with meals  The patient will return in 2-4 weeks for  follow-up and possible additional banding as required. No complications were encountered and the patient tolerated the procedure well.  Iona Beard , MD [phone]

## 2019-09-25 NOTE — Patient Instructions (Signed)

## 2019-11-08 ENCOUNTER — Other Ambulatory Visit: Payer: Self-pay

## 2019-11-08 ENCOUNTER — Encounter: Payer: Self-pay | Admitting: Gastroenterology

## 2019-11-08 ENCOUNTER — Ambulatory Visit (INDEPENDENT_AMBULATORY_CARE_PROVIDER_SITE_OTHER): Payer: BC Managed Care – PPO | Admitting: Gastroenterology

## 2019-11-08 VITALS — BP 144/62 | HR 67 | Temp 97.6°F | Ht 65.0 in | Wt 143.0 lb

## 2019-11-08 DIAGNOSIS — K641 Second degree hemorrhoids: Secondary | ICD-10-CM | POA: Diagnosis not present

## 2019-11-08 NOTE — Patient Instructions (Signed)
HEMORRHOID BANDING PROCEDURE    FOLLOW-UP CARE   1. The procedure you have had should have been relatively painless since the banding of the area involved does not have nerve endings and there is no pain sensation.  The rubber band cuts off the blood supply to the hemorrhoid and the band may fall off as soon as 48 hours after the banding (the band may occasionally be seen in the toilet bowl following a bowel movement). You may notice a temporary feeling of fullness in the rectum which should respond adequately to plain Tylenol or Motrin.  2. Following the banding, avoid strenuous exercise that evening and resume full activity the next day.  A sitz bath (soaking in a warm tub) or bidet is soothing, and can be useful for cleansing the area after bowel movements.     3. To avoid constipation, take two tablespoons of natural wheat bran, natural oat bran, flax, Benefiber or any over the counter fiber supplement and increase your water intake to 7-8 glasses daily.    4. Unless you have been prescribed anorectal medication, do not put anything inside your rectum for two weeks: No suppositories, enemas, fingers, etc.  5. Occasionally, you may have more bleeding than usual after the banding procedure.  This is often from the untreated hemorrhoids rather than the treated one.  Don't be concerned if there is a tablespoon or so of blood.  If there is more blood than this, lie flat with your bottom higher than your head and apply an ice pack to the area. If the bleeding does not stop within a half an hour or if you feel faint, call our office at [phone] or go to the emergency room.  6. Problems are not common; however, if there is a substantial amount of bleeding, severe pain, chills, fever or difficulty passing urine (very rare) or other problems, you should call us at [phone] or report to the nearest emergency room.  7. Do not stay seated continuously for more than 2-3 hours for a day or two  after the procedure.  Tighten your buttock muscles 10-15 times every two hours and take 10-15 deep breaths every 1-2 hours.  Do not spend more than a few minutes on the toilet if you cannot empty your bowel; instead re-visit the toilet at a later time.    Follow up as needed.  Thank you for choosing me and [facility].   Marsa Aris, MD

## 2019-11-08 NOTE — Progress Notes (Signed)
PROCEDURE NOTE: The patient presents with symptomatic grade II  hemorrhoids, requesting rubber band ligation of his/her hemorrhoidal disease.  All risks, benefits and alternative forms of therapy were described and informed consent was obtained.   The anorectum was pre-medicated with 0.125% NTG and Recticare The decision was made to band the Right anterior internal hemorrhoid, and the [facility] O'Regan System was used to perform band ligation without complication.  Digital anorectal examination was then performed to assure proper positioning of the band, and to adjust the banded tissue as required.  The patient was discharged home without pain or other issues.  Dietary and behavioral recommendations were given and along with follow-up instructions.      The patient will return for  follow-up and possible additional banding as needed. No complications were encountered and the patient tolerated the procedure well.  K. Veena Catelynn Sparger , MD [phone]   

## 2019-11-09 ENCOUNTER — Ambulatory Visit: Payer: BC Managed Care – PPO | Admitting: Internal Medicine

## 2019-12-19 ENCOUNTER — Ambulatory Visit (INDEPENDENT_AMBULATORY_CARE_PROVIDER_SITE_OTHER): Payer: BC Managed Care – PPO | Admitting: Internal Medicine

## 2019-12-19 ENCOUNTER — Encounter: Payer: Self-pay | Admitting: Internal Medicine

## 2019-12-19 ENCOUNTER — Other Ambulatory Visit: Payer: Self-pay

## 2019-12-19 VITALS — BP 110/62 | HR 70 | Ht 65.5 in | Wt 141.0 lb

## 2019-12-19 DIAGNOSIS — Z8639 Personal history of other endocrine, nutritional and metabolic disease: Secondary | ICD-10-CM

## 2019-12-19 DIAGNOSIS — E042 Nontoxic multinodular goiter: Secondary | ICD-10-CM | POA: Diagnosis not present

## 2019-12-19 LAB — T3, FREE: T3, Free: 3.1 pg/mL (ref 2.3–4.2)

## 2019-12-19 LAB — T4, FREE: Free T4: 0.83 ng/dL (ref 0.60–1.60)

## 2019-12-19 LAB — TSH: TSH: 1.52 u[IU]/mL (ref 0.35–4.50)

## 2019-12-19 NOTE — Progress Notes (Signed)
Patient ID: Alison Holloway, female   DOB: 05-Dec-1960, 59 y.o.   MRN: [id number]  This visit occurred during the SARS-CoV-2 public health emergency.  Safety protocols were in place, including screening questions prior to the visit, additional usage of staff PPE, and extensive cleaning of exam room while observing appropriate contact time as indicated for disinfecting solutions.   HPI  Alison Holloway is a 59 y.o.-year-old female, self-referred, for management of h/o Graves ds., h/o post-ablative hypothyroidism, thyroid nodules. Last visit 1 year and 1 month ago.  She continues to have a lot of stress at home: husband with lung cancer.  Reviewed history:  Pt. has been dx with Graves ds ~1996, s/p RAI tx in ~2000 (no records available), then developed hypothyroidism afterwards >> was on Levothyroxine then stopped ~2011 as she started to feel hyperthyroid again >> resolved after stopping levothyroxine.  She describes that starting in early 09/2018 she developed: diarrhea, palpitations, increased insomnia, felt "in overdrive", also noticed a new "knot" in neck. She is also very tired, continuing losing weight.  She suspected thyrotoxicosis and saw PCP.  TFTs were normal, though.  Her TFTs were normal: Lab Results  Component Value Date   TSH 1.48 10/11/2018   TSH 1.61 12/06/2017   TSH 1.07 06/14/2015   TSH 1.870 05/22/2014   TSH 1.95 05/30/2012   TSH 1.44 08/27/2010   TSH 1.45 02/05/2010   TSH 1.08 06/18/2008   TSH 1.65 06/28/2007   TSH 1.61 06/25/2006   FREET4 0.83 10/11/2018   FREET4 0.76 12/06/2017   FREET4 1.15 05/22/2014    Thyroid ultrasound (06/06/2014): Right thyroid lobe: 7.1 x 2.9 x 3.9 cm.  Solid complex right upper pole nodule with calcifications measures 4.4 x 2.5 x 3.3 cm.  Hypoechoic lower pole nodule measures 1.6 x 1.3 x 1.7 cm. Left thyroid lobe: 4.4 x 0.9 x 1.2 cm.   5 mm upper pole nodule.  5 mm lowerpole nodule. Isthmus Thickness: 2 mm. No nodules  visualized. Lymphadenopathy: None visualized.  IMPRESSION: Bilateral nodules. Dominant 4.4 cm right upper pole complex nodule. Findings meet consensus criteria for biopsy.  FNA of the 2 dominant nodules:  FNA of the 2 right thyroid nodules (06/13/2014): Adequacy Reason Satisfactory For Evaluation. Diagnosis THYROID, FINE NEEDLE ASPIRATION, RLP (SPECIMEN 1 OF 2 COLLECTED ON 06/12/2014) BENIGN. 9BETHESDA CLASS II). FINDINGS CONSISTENT WITH BENIGN FOLLICULAR NODULE. Jimmy Picket MD Pathologist, Electronic Signature (Case signed 06/13/2014) Specimen Clinical Information Hypothyroidism, RLP hypoechoic nodule 1.6 x 1.3 x 1.7cm Source Thyroid, Fine Needle Aspiration, RLP, (Specimen 1 of 2, collected on 06/12/2014)  Adequacy Reason Satisfactory For Evaluation. Diagnosis THYROID, FINE NEEDLE ASPIRATION, RUP (SPECIMEN 2 OF 2, COLLECTED ON 06/12/14): BENIGN. (BETHESDA CLASS II). FINDINGS CONSISTENT WITH BENIGN FOLLICULAR NODULE. Jimmy Picket MD Pathologist, Electronic Signature (Case signed 06/13/2014) Specimen Clinical Information Hypothyroidsim, RUP solid complex nodule with microcalcifications 4.4 x 2.5 x 3.3cm Source Thyroid, Fine Needle Aspiration, RUP, (Specimen 2 of 2, collected on 06/12/2014)  Thyroid U/S (11/14/2019): Parenchymal Echotexture: Mildly heterogenous Isthmus: Normal in size measures 0.5 cm in diameter unchanged Right lobe: Enlarged measuring 7.2 x 2.6 x 3 2 cm, unchanged from previously 7.1 x 2.9 x 3.9 cm Left lobe: Normal in size measuring 4.4 x 1.5 x 1.2 cm, unchanged, previously, 4.4 x 0.9 x 1.2 cm __________________________________________________________  The previously biopsied approximately 4.4 x 3.4 x 2.5 cm nodule (labeled 1) within the mid aspect of the right lobe of the thyroid is unchanged compared to the 05/2014 examination, previously, 4.4  x 3.3 x 2.5 cm. Correlation with prior biopsy results is recommended.  The previously biopsied  approximately 1.9 x 1.7 x 1.3 cm hypoechoic nodule within the inferior pole of the right lobe of the thyroid (labeled 2) is unchanged compared to the 05/2014 examination, previously, 1.7 x 1.6 x 1.3 cm. Correlation with prior biopsy results is recommended.  The approximately 0.6 x 0.5 x 0.5 cm isoechoic nodule within the mid, inferior aspect of the left lobe of the thyroid (labeled 3), is unchanged compared to the 05/2014 examination previously, 0.5 x 0.4 x 0.4 cm, and again does not meet imaging criteria to recommend percutaneous sampling or continued dedicated follow-up.  The approximately 0.4 cm hypoechoic nodule within mid aspect of the right lobe of the thyroid (labeled 4), is unchanged compared to the 05/2014 examination, previously, 0.5 cm, and again does not meet imaging criteria to recommend percutaneous sampling or continued dedicated follow-up.  IMPRESSION: 1. Similar findings of multinodular goiter. No worrisome new or enlarging thyroid nodules. 2. Previously biopsied right-sided thyroid nodules (labeled 1 and 2) are unchanged compared to the 05/2014 examination. Correlation with prior biopsy results is recommended. Assuming a benign pathologic diagnosis, repeat sampling and/or continued dedicated follow-up is not recommended.  Pt denies: - feeling nodules in neck - hoarseness - dysphagia - choking - SOB with lying down She occasionally has sore throat from chronic postnasal drip.  She has + FH of thyroid disorders in: hyperthyroidism. No FH of thyroid cancer. No h/o radiation tx to head or neck.  No herbal supplements. No Biotin use. No recent steroids use.   She continues to walk for exercise.  At last visit, she described fatigue.  TFTs were normal.  We checked her cortisol and ACTH and these were also normal.  ROS: Constitutional: no weight gain/no weight loss, no fatigue, no subjective hyperthermia, no subjective hypothermia Eyes: no blurry vision, no  xerophthalmia ENT: no sore throat, + see HPI, + congestion Cardiovascular: no CP/no SOB/no palpitations/no leg swelling Respiratory: no cough/no SOB/no wheezing Gastrointestinal: no N/no V/no D/no C/no acid reflux Musculoskeletal: no muscle aches/no joint aches Skin: no rashes, no hair loss Neurological: no tremors/no numbness/no tingling/no dizziness  I reviewed pt's medications, allergies, PMH, social hx, family hx, and changes were documented in the history of present illness. Otherwise, unchanged from my initial visit note.  Past Medical History:  Diagnosis Date  . Allergy   . Anal fissure   . Endometriosis   . Hyperthyroidism    radioactive treatment   Past Surgical History:  Procedure Laterality Date  . anal fistula surgery    . LAPAROSCOPY     x2- for endometriosis   History   Social History  . Marital Status: Married    Spouse Name: N/A    Number of Children: 2   Occupational History  . none   Social History Main Topics  . Smoking status: Former Research scientist (life sciences), quit in 1999  . Smokeless tobacco: No  . Alcohol Use: No  . Drug Use: No   Current Outpatient Medications on File Prior to Visit  Medication Sig Dispense Refill  . loratadine (CLARITIN) 10 MG tablet Take 10 mg by mouth daily.     No current facility-administered medications on file prior to visit.   Allergies  Allergen Reactions  . Keflex [Cephalexin]     REACTION: rash   Family History  Problem Relation Age of Onset  . Stroke Mother   . Hypothyroidism Mother   . Hypertension Mother   .  Hypertension Father   . Depression Brother   . Colon cancer Brother        age 52   . Colon polyps Neg Hx   . Esophageal cancer Neg Hx   . Rectal cancer Neg Hx   . Stomach cancer Neg Hx    PE: BP 110/62   Pulse 70   Ht 5' 5.5" (1.664 m)   Wt 141 lb (64 kg)   LMP 12/23/2011   SpO2 98%   BMI 23.11 kg/m  Body mass index is 23.43 kg/m.  Wt Readings from Last 3 Encounters:  12/19/19 141 lb (64 kg)   11/08/19 143 lb (64.9 kg)  09/25/19 139 lb (63 kg)   Constitutional: normal weight, in NAD Eyes: PERRLA, EOMI, no exophthalmos ENT: moist mucous membranes, no thyromegaly, no cervical lymphadenopathy Cardiovascular: RRR, No MRG Respiratory: CTA B Gastrointestinal: abdomen soft, NT, ND, BS+ Musculoskeletal: no deformities, strength intact in all 4 Skin: moist, warm, no rashes Neurological: no tremor with outstretched hands, DTR normal in all 4  ASSESSMENT: 1. H/o Graves ds.  2. H/o transient Hypothyroidism  3.  Thyroid nodules  PLAN:  1. History of Graves' disease  And 2.  History of transient hypothyroidism -Patient was diagnosed with Graves' disease in the 1990s and had RAI treatment 5 years later.  She developed transient hypothyroidism.  She was previously on levothyroxine but was able to come off after TFTs were in the thyrotoxic range on the medication.   - Thyroid tests remain normal, last win 09/2018, at our last visit: Lab Results  Component Value Date   TSH 1.48 10/11/2018  -She denies hypothyroid symptoms, but gained 20 lbs during the pandemic -  Baking more -We will recheck her TFTs today, but from now on, she can follow-up with PCP with annual TSH levels and refer back to me if they become abnormal.  3.  Thyroid nodules -We reviewed together the results of her 2020 thyroid ultrasound: She has 2 dominant right thyroid nodules, which were biopsied in 2015 with benign results in which have not changed in 6 years.  She also has a few small, subcentimeter nodules, not worrisome and unchanged.   -No further follow-up is needed due to dominant nodule stability over at least 5 years and previous benign biopsies. -She denies neck compression symptoms  Component     Latest Ref Rng & Units 12/19/2019  TSH     0.35 - 4.50 uIU/mL 1.52  T4,Free(Direct)     0.60 - 1.60 ng/dL 7.10  Triiodothyronine,Free,Serum     2.3 - 4.2 pg/mL 3.1  Normal TFTs.   Carlus Pavlov, MD  PhD [facility]

## 2019-12-19 NOTE — Patient Instructions (Signed)
You can return to see me as needed.  Please have Dr. Fabian Sharp check a TSH level every year.  Please return if this becomes abnormal.

## 2021-06-17 ENCOUNTER — Ambulatory Visit: Payer: BC Managed Care – PPO | Admitting: Gastroenterology

## 2021-07-21 DIAGNOSIS — Z1231 Encounter for screening mammogram for malignant neoplasm of breast: Secondary | ICD-10-CM | POA: Diagnosis not present

## 2021-07-21 DIAGNOSIS — Z124 Encounter for screening for malignant neoplasm of cervix: Secondary | ICD-10-CM | POA: Diagnosis not present

## 2021-07-21 DIAGNOSIS — Z01419 Encounter for gynecological examination (general) (routine) without abnormal findings: Secondary | ICD-10-CM | POA: Diagnosis not present

## 2021-07-21 DIAGNOSIS — Z6823 Body mass index (BMI) 23.0-23.9, adult: Secondary | ICD-10-CM | POA: Diagnosis not present

## 2021-08-08 ENCOUNTER — Ambulatory Visit: Payer: BC Managed Care – PPO | Admitting: Gastroenterology

## 2021-09-26 ENCOUNTER — Encounter: Payer: BC Managed Care – PPO | Admitting: Internal Medicine

## 2022-03-12 ENCOUNTER — Encounter: Payer: Self-pay | Admitting: Internal Medicine

## 2022-03-12 ENCOUNTER — Ambulatory Visit (INDEPENDENT_AMBULATORY_CARE_PROVIDER_SITE_OTHER): Payer: BC Managed Care – PPO | Admitting: Internal Medicine

## 2022-03-12 VITALS — BP 120/80 | HR 74 | Resp 18 | Ht 65.5 in | Wt 160.0 lb

## 2022-03-12 DIAGNOSIS — Z23 Encounter for immunization: Secondary | ICD-10-CM | POA: Diagnosis not present

## 2022-03-12 DIAGNOSIS — E042 Nontoxic multinodular goiter: Secondary | ICD-10-CM

## 2022-03-12 DIAGNOSIS — Z0001 Encounter for general adult medical examination with abnormal findings: Secondary | ICD-10-CM

## 2022-03-12 DIAGNOSIS — F4321 Adjustment disorder with depressed mood: Secondary | ICD-10-CM | POA: Diagnosis not present

## 2022-03-12 LAB — COMPREHENSIVE METABOLIC PANEL
ALT: 16 U/L (ref 0–35)
AST: 20 U/L (ref 0–37)
Albumin: 4.5 g/dL (ref 3.5–5.2)
Alkaline Phosphatase: 73 U/L (ref 39–117)
BUN: 18 mg/dL (ref 6–23)
CO2: 30 mEq/L (ref 19–32)
Calcium: 9.6 mg/dL (ref 8.4–10.5)
Chloride: 105 mEq/L (ref 96–112)
Creatinine, Ser: 0.65 mg/dL (ref 0.40–1.20)
GFR: 94.93 mL/min (ref >60.00)
Glucose, Bld: 83 mg/dL (ref 70–99)
Potassium: 4.1 mEq/L (ref 3.5–5.1)
Sodium: 143 mEq/L (ref 135–145)
Total Bilirubin: 0.5 mg/dL (ref 0.2–1.2)
Total Protein: 7.4 g/dL (ref 6.0–8.3)

## 2022-03-12 LAB — CBC
HCT: 37.9 % (ref 36.0–46.0)
Hemoglobin: 12.7 g/dL (ref 12.0–15.0)
MCHC: 33.4 g/dL (ref 30.0–36.0)
MCV: 95.4 fl (ref 78.0–100.0)
Platelets: 186 10*3/uL (ref 150.0–400.0)
RBC: 3.97 Mil/uL (ref 3.87–5.11)
RDW: 12.7 % (ref 11.5–15.5)
WBC: 6.6 10*3/uL (ref 4.0–10.5)

## 2022-03-12 LAB — LIPID PANEL
Cholesterol: 193 mg/dL (ref 0–200)
HDL: 81.3 mg/dL (ref >39.00)
LDL Cholesterol: 96 mg/dL (ref 0–99)
NonHDL: 112.09
Total CHOL/HDL Ratio: 2
Triglycerides: 78 mg/dL (ref 0.0–149.0)
VLDL: 15.6 mg/dL (ref 0.0–40.0)

## 2022-03-12 LAB — T4, FREE: Free T4: 0.79 ng/dL (ref 0.60–1.60)

## 2022-03-12 LAB — TSH: TSH: 1.49 u[IU]/mL (ref 0.35–5.50)

## 2022-03-12 MED ORDER — BUPROPION HCL ER (XL) 150 MG PO TB24: 150.0000 mg | ORAL_TABLET | Freq: Every day | ORAL | 3 refills | Status: DC

## 2022-03-12 NOTE — Progress Notes (Signed)
   Subjective:   Patient ID: Alison Holloway, female    DOB: Jan 12, 1961, 61 y.o.   MRN: [id number]  HPI The patient is a new 61 YO female coming in for physical and concerns.  PMH, [facility], social history reviewed and updated  Review of Systems  Constitutional: Negative.   HENT: Negative.    Eyes: Negative.   Respiratory:  Negative for cough, chest tightness and shortness of breath.   Cardiovascular:  Negative for chest pain, palpitations and leg swelling.  Gastrointestinal:  Negative for abdominal distention, abdominal pain, constipation, diarrhea, nausea and vomiting.  Musculoskeletal: Negative.   Skin: Negative.   Neurological: Negative.   Psychiatric/Behavioral:  Positive for decreased concentration and dysphoric mood.     Objective:  Physical Exam Constitutional:      Appearance: She is well-developed.  HENT:     Head: Normocephalic and atraumatic.  Cardiovascular:     Rate and Rhythm: Normal rate and regular rhythm.  Pulmonary:     Effort: Pulmonary effort is normal. No respiratory distress.     Breath sounds: Normal breath sounds. No wheezing or rales.  Abdominal:     General: Bowel sounds are normal. There is no distension.     Palpations: Abdomen is soft.     Tenderness: There is no abdominal tenderness. There is no rebound.  Musculoskeletal:     Cervical back: Normal range of motion.  Skin:    General: Skin is warm and dry.  Neurological:     Mental Status: She is alert and oriented to person, place, and time.     Coordination: Coordination normal.     Vitals:   03/12/22 0952  BP: 120/80  Pulse: 74  Resp: 18  SpO2: 98%  Weight: 160 lb (72.6 kg)  Height: 5' 5.5" (1.664 m)    Assessment & Plan:  Shingrix IM given at visit

## 2022-03-12 NOTE — Patient Instructions (Signed)
We have sent in wellbutrin to take 1 pill daily.

## 2022-03-13 DIAGNOSIS — Z0001 Encounter for general adult medical examination with abnormal findings: Secondary | ICD-10-CM | POA: Insufficient documentation

## 2022-03-13 DIAGNOSIS — F4320 Adjustment disorder, unspecified: Secondary | ICD-10-CM | POA: Insufficient documentation

## 2022-03-13 NOTE — Assessment & Plan Note (Signed)
Checking TSH and free T4. Reviewed [place] from prior records and does not appear to need follow up US imaging unless change in symptoms or levels. Adjust as needed not on medication.

## 2022-03-13 NOTE — Assessment & Plan Note (Signed)
Husband with stage 4 cancer and she is wanting to resume wellbutrin 150 mg daily which she had taken some years ago. Rx done today and counseled about timeline to see benefit. We can increase dosing in 3-4 weeks if needed. Also counseled on safe discontinuation as she may not want to stay on this long term.

## 2022-03-13 NOTE — Assessment & Plan Note (Signed)
Flu shot yearly. Covid-19 counseled. Shingrix given 1st. Tetanus due will get with 2nd shingles. Colonoscopy up to date. Mammogram up to date, pap smear up to date. Counseled about sun safety and mole surveillance. Counseled about the dangers of distracted driving. Given 10 year screening recommendations.

## 2022-06-12 ENCOUNTER — Ambulatory Visit (INDEPENDENT_AMBULATORY_CARE_PROVIDER_SITE_OTHER): Payer: BC Managed Care – PPO | Admitting: *Deleted

## 2022-06-12 DIAGNOSIS — Z23 Encounter for immunization: Secondary | ICD-10-CM | POA: Diagnosis not present

## 2022-06-12 NOTE — Progress Notes (Signed)
Administered 2nd shingle right deltoid and Tdap left deltoid. Pt tolerated well.

## 2023-04-27 ENCOUNTER — Encounter: Payer: Self-pay | Admitting: Internal Medicine

## 2023-04-27 ENCOUNTER — Ambulatory Visit: Payer: BC Managed Care – PPO | Admitting: Internal Medicine

## 2023-04-27 VITALS — BP 140/70 | HR 63 | Temp 98.8°F | Ht 65.5 in | Wt 158.0 lb

## 2023-04-27 DIAGNOSIS — Z0000 Encounter for general adult medical examination without abnormal findings: Secondary | ICD-10-CM | POA: Diagnosis not present

## 2023-04-27 DIAGNOSIS — Z0001 Encounter for general adult medical examination with abnormal findings: Secondary | ICD-10-CM

## 2023-04-27 DIAGNOSIS — E042 Nontoxic multinodular goiter: Secondary | ICD-10-CM

## 2023-04-27 NOTE — Progress Notes (Signed)
   Subjective:   Patient ID: Alison Holloway, female    DOB: [birth year], 62 y.o.   MRN: [id number]  HPI The patient is here for physical.  PMH, [facility], social history reviewed and updated  Review of Systems  Constitutional: Negative.   HENT: Negative.    Eyes: Negative.   Respiratory:  Negative for cough, chest tightness and shortness of breath.   Cardiovascular:  Negative for chest pain, palpitations and leg swelling.  Gastrointestinal:  Negative for abdominal distention, abdominal pain, constipation, diarrhea, nausea and vomiting.  Musculoskeletal: Negative.   Skin: Negative.   Neurological: Negative.   Psychiatric/Behavioral: Negative.      Objective:  Physical Exam Constitutional:      Appearance: She is well-developed.  HENT:     Head: Normocephalic and atraumatic.  Cardiovascular:     Rate and Rhythm: Normal rate and regular rhythm.  Pulmonary:     Effort: Pulmonary effort is normal. No respiratory distress.     Breath sounds: Normal breath sounds. No wheezing or rales.  Abdominal:     General: Bowel sounds are normal. There is no distension.     Palpations: Abdomen is soft.     Tenderness: There is no abdominal tenderness. There is no rebound.  Musculoskeletal:     Cervical back: Normal range of motion.  Skin:    General: Skin is warm and dry.  Neurological:     Mental Status: She is alert and oriented to person, place, and time.     Coordination: Coordination normal.     Vitals:   04/27/23 1342 04/27/23 1344  BP: (!) 140/70 (!) 140/70  Pulse: 63   Temp: 98.8 F (37.1 C)   TempSrc: Oral   SpO2: 95%   Weight: 158 lb (71.7 kg)   Height: 5' 5.5" (1.664 m)     Assessment & Plan:

## 2023-04-28 NOTE — Assessment & Plan Note (Signed)
Flu shot yearly. Shingrix complete. Tetanus up to date. Colonoscopy up to date. Mammogram up to date, pap smear up to date. Counseled about sun safety and mole surveillance. Counseled about the dangers of distracted driving. Given 10 year screening recommendations.

## 2023-04-28 NOTE — Assessment & Plan Note (Signed)
Will check labs every 2 years not due this year.

## 2023-05-13 DIAGNOSIS — Z1331 Encounter for screening for depression: Secondary | ICD-10-CM | POA: Diagnosis not present

## 2023-05-13 DIAGNOSIS — Z1231 Encounter for screening mammogram for malignant neoplasm of breast: Secondary | ICD-10-CM | POA: Diagnosis not present

## 2023-05-13 DIAGNOSIS — Z01419 Encounter for gynecological examination (general) (routine) without abnormal findings: Secondary | ICD-10-CM | POA: Diagnosis not present

## 2023-05-13 LAB — HM MAMMOGRAPHY

## 2024-09-05 ENCOUNTER — Encounter: Payer: Self-pay | Admitting: Internal Medicine

## 2024-09-05 ENCOUNTER — Ambulatory Visit

## 2024-09-05 ENCOUNTER — Ambulatory Visit: Payer: Self-pay | Admitting: Internal Medicine

## 2024-09-05 VITALS — BP 134/82 | HR 64 | Temp 98.2°F | Ht 65.5 in | Wt 156.2 lb

## 2024-09-05 DIAGNOSIS — G8929 Other chronic pain: Secondary | ICD-10-CM

## 2024-09-05 DIAGNOSIS — M542 Cervicalgia: Secondary | ICD-10-CM

## 2024-09-05 DIAGNOSIS — E042 Nontoxic multinodular goiter: Secondary | ICD-10-CM

## 2024-09-05 DIAGNOSIS — Z23 Encounter for immunization: Secondary | ICD-10-CM

## 2024-09-05 DIAGNOSIS — Z0001 Encounter for general adult medical examination with abnormal findings: Secondary | ICD-10-CM | POA: Diagnosis not present

## 2024-09-05 LAB — LIPID PANEL
Cholesterol: 202 mg/dL — ABNORMAL HIGH (ref 28–200)
HDL: 88.9 mg/dL
LDL Cholesterol: 88 mg/dL (ref 10–99)
NonHDL: 113.22
Total CHOL/HDL Ratio: 2
Triglycerides: 126 mg/dL (ref 10.0–149.0)
VLDL: 25.2 mg/dL (ref 0.0–40.0)

## 2024-09-05 LAB — CBC
HCT: 38.8 % (ref 36.0–46.0)
Hemoglobin: 13.2 g/dL (ref 12.0–15.0)
MCHC: 34 g/dL (ref 30.0–36.0)
MCV: 93.7 fl (ref 78.0–100.0)
Platelets: 211 K/uL (ref 150.0–400.0)
RBC: 4.15 Mil/uL (ref 3.87–5.11)
RDW: 13.1 % (ref 11.5–15.5)
WBC: 7.2 K/uL (ref 4.0–10.5)

## 2024-09-05 LAB — COMPREHENSIVE METABOLIC PANEL WITH GFR
ALT: 17 U/L (ref 3–35)
AST: 21 U/L (ref 5–37)
Albumin: 4.5 g/dL (ref 3.5–5.2)
Alkaline Phosphatase: 77 U/L (ref 39–117)
BUN: 19 mg/dL (ref 6–23)
CO2: 30 meq/L (ref 19–32)
Calcium: 9.7 mg/dL (ref 8.4–10.5)
Chloride: 103 meq/L (ref 96–112)
Creatinine, Ser: 0.69 mg/dL (ref 0.40–1.20)
GFR: 91.95 mL/min
Glucose, Bld: 73 mg/dL (ref 70–99)
Potassium: 3.6 meq/L (ref 3.5–5.1)
Sodium: 141 meq/L (ref 135–145)
Total Bilirubin: 0.5 mg/dL (ref 0.2–1.2)
Total Protein: 7.6 g/dL (ref 6.0–8.3)

## 2024-09-05 LAB — T4, FREE: Free T4: 0.83 ng/dL (ref 0.60–1.60)

## 2024-09-05 LAB — TSH: TSH: 1.7 u[IU]/mL (ref 0.35–5.50)

## 2024-09-05 NOTE — Patient Instructions (Signed)
Think about the pneumonia vaccine.

## 2024-09-05 NOTE — Progress Notes (Signed)
" ° °  Subjective:   Patient ID: Alison Holloway, female    DOB: 07/15/1961, 64 y.o.   MRN: [id number]  The patient is here for physical. Pertinent topics discussed: Discussed the use of AI scribe software for clinical note transcription with the patient, who gave verbal consent to proceed.  History of Present Illness Alison Holloway is a 64 year old female who presents with persistent neck tightness.  She has experienced persistent neck tightness for several months, possibly up to a year. The sensation is described as a 'tightness' that does not resolve and may be related to physical activity, such as yard work. The tightness restricts neck movement but does not prevent her from performing daily activities. She recalls a past thyroid  issue with nodules noted on the back of her neck, which she feels might be related to her current symptoms.  Her past medical history includes thyroid  nodules, which were stable on the last ultrasound five years ago. She has had biopsies of these nodules in the past, which were benign. She has not experienced any changes in swallowing or visible changes in her neck.  No new chest pain, breathing difficulties, or gastrointestinal issues beyond her normal stomach problems. She is up to date on her colon cancer screening and has an upcoming OB appointment.  PMH, [facility], social history reviewed and updated  Review of Systems  Constitutional: Negative.   HENT: Negative.    Eyes: Negative.   Respiratory:  Negative for cough, chest tightness and shortness of breath.   Cardiovascular:  Negative for chest pain, palpitations and leg swelling.  Gastrointestinal:  Negative for abdominal distention, abdominal pain, constipation, diarrhea, nausea and vomiting.  Musculoskeletal:  Positive for neck pain and neck stiffness.  Skin: Negative.   Neurological: Negative.   Psychiatric/Behavioral: Negative.      Objective:  Physical Exam Constitutional:      Appearance: She is  well-developed.  HENT:     Head: Normocephalic and atraumatic.  Cardiovascular:     Rate and Rhythm: Normal rate and regular rhythm.  Pulmonary:     Effort: Pulmonary effort is normal. No respiratory distress.     Breath sounds: Normal breath sounds. No wheezing or rales.  Abdominal:     General: Bowel sounds are normal. There is no distension.     Palpations: Abdomen is soft.     Tenderness: There is no abdominal tenderness.  Musculoskeletal:     Cervical back: Normal range of motion.  Skin:    General: Skin is warm and dry.  Neurological:     Mental Status: She is alert and oriented to person, place, and time.     Coordination: Coordination normal.     Vitals:   09/05/24 1443  BP: 134/82  Pulse: 64  Temp: 98.2 F (36.8 C)  SpO2: 99%  Weight: 156 lb 3.2 oz (70.9 kg)  Height: 5' 5.5 (1.664 m)    Assessment & Plan:  Prevnar 20 given at visit "

## 2024-09-06 ENCOUNTER — Ambulatory Visit: Payer: Self-pay | Admitting: Internal Medicine

## 2024-09-08 DIAGNOSIS — G8929 Other chronic pain: Secondary | ICD-10-CM | POA: Insufficient documentation

## 2024-09-08 NOTE — Assessment & Plan Note (Signed)
 Ordered x-ray cervical to assess chronic neck pain.

## 2024-09-08 NOTE — Assessment & Plan Note (Signed)
 Flu shot declines. Pneumonia given. Shingrix  up to date. Tetanus up to dat. Colonoscopy upto date. Mammogram up to date, pap smear with gyn. Counseled about sun safety and mole surveillance. Counseled about the dangers of distracted driving. Given 10 year screening recommendations.

## 2024-09-08 NOTE — Assessment & Plan Note (Signed)
 Checking TSH and free T4 as needed.
